# Patient Record
Sex: Male | Born: 1945 | Race: White | Hispanic: No | Marital: Married | Smoking: Never smoker
Health system: Southern US, Community
[De-identification: ages and names within clinical notes are randomized; demographics above are authoritative.]

## PROBLEM LIST (undated history)

## (undated) DIAGNOSIS — K219 Gastro-esophageal reflux disease without esophagitis: Secondary | ICD-10-CM

## (undated) DIAGNOSIS — K635 Polyp of colon: Secondary | ICD-10-CM

## (undated) DIAGNOSIS — M1990 Unspecified osteoarthritis, unspecified site: Secondary | ICD-10-CM

## (undated) DIAGNOSIS — H9190 Unspecified hearing loss, unspecified ear: Secondary | ICD-10-CM

## (undated) DIAGNOSIS — K5792 Diverticulitis of intestine, part unspecified, without perforation or abscess without bleeding: Secondary | ICD-10-CM

## (undated) DIAGNOSIS — H269 Unspecified cataract: Secondary | ICD-10-CM

## (undated) DIAGNOSIS — I10 Essential (primary) hypertension: Secondary | ICD-10-CM

## (undated) HISTORY — PX: KNEE ARTHROSCOPY: SUR90

## (undated) HISTORY — PX: JOINT REPLACEMENT: SHX530

## (undated) HISTORY — DX: Unspecified hearing loss, unspecified ear: H91.90

## (undated) HISTORY — PX: PARTIAL COLECTOMY: SHX5273

## (undated) HISTORY — DX: Diverticulitis of intestine, part unspecified, without perforation or abscess without bleeding: K57.92

## (undated) HISTORY — DX: Morbid (severe) obesity due to excess calories: E66.01

## (undated) HISTORY — DX: Unspecified osteoarthritis, unspecified site: M19.90

## (undated) HISTORY — DX: Polyp of colon: K63.5

## (undated) HISTORY — PX: COLONOSCOPY W/ POLYPECTOMY: SHX1380

## (undated) HISTORY — PX: BACK SURGERY: SHX140

## (undated) HISTORY — DX: Unspecified cataract: H26.9

---

## 2002-06-30 ENCOUNTER — Ambulatory Visit (HOSPITAL_COMMUNITY): Admission: RE | Admit: 2002-06-30 | Discharge: 2002-06-30 | Payer: Self-pay | Admitting: *Deleted

## 2002-06-30 ENCOUNTER — Encounter (INDEPENDENT_AMBULATORY_CARE_PROVIDER_SITE_OTHER): Payer: Self-pay | Admitting: Specialist

## 2003-08-31 ENCOUNTER — Encounter: Payer: Self-pay | Admitting: Family Medicine

## 2003-08-31 ENCOUNTER — Encounter: Admission: RE | Admit: 2003-08-31 | Discharge: 2003-08-31 | Payer: Self-pay | Admitting: Family Medicine

## 2004-04-06 ENCOUNTER — Ambulatory Visit (HOSPITAL_COMMUNITY): Admission: RE | Admit: 2004-04-06 | Discharge: 2004-04-06 | Payer: Self-pay | Admitting: Orthopedic Surgery

## 2005-01-16 ENCOUNTER — Ambulatory Visit (HOSPITAL_COMMUNITY): Admission: RE | Admit: 2005-01-16 | Discharge: 2005-01-16 | Payer: Self-pay | Admitting: Orthopedic Surgery

## 2006-03-30 ENCOUNTER — Emergency Department (HOSPITAL_COMMUNITY): Admission: EM | Admit: 2006-03-30 | Discharge: 2006-03-31 | Payer: Self-pay | Admitting: Emergency Medicine

## 2008-02-26 ENCOUNTER — Encounter: Admission: RE | Admit: 2008-02-26 | Discharge: 2008-02-26 | Payer: Self-pay | Admitting: Family Medicine

## 2009-02-11 ENCOUNTER — Encounter: Admission: RE | Admit: 2009-02-11 | Discharge: 2009-02-11 | Payer: Self-pay | Admitting: Family Medicine

## 2009-02-16 ENCOUNTER — Inpatient Hospital Stay (HOSPITAL_COMMUNITY): Admission: AD | Admit: 2009-02-16 | Discharge: 2009-02-23 | Payer: Self-pay | Admitting: General Surgery

## 2009-08-02 ENCOUNTER — Encounter: Admission: RE | Admit: 2009-08-02 | Discharge: 2009-08-02 | Payer: Self-pay | Admitting: Family Medicine

## 2010-03-01 ENCOUNTER — Inpatient Hospital Stay (HOSPITAL_COMMUNITY): Admission: AD | Admit: 2010-03-01 | Discharge: 2010-03-04 | Payer: Self-pay | Admitting: Gastroenterology

## 2010-03-01 ENCOUNTER — Encounter: Admission: RE | Admit: 2010-03-01 | Discharge: 2010-03-01 | Payer: Self-pay | Admitting: Gastroenterology

## 2010-03-03 ENCOUNTER — Ambulatory Visit: Payer: Self-pay | Admitting: Infectious Disease

## 2010-03-08 ENCOUNTER — Encounter: Admission: RE | Admit: 2010-03-08 | Discharge: 2010-03-08 | Payer: Self-pay | Admitting: Gastroenterology

## 2010-03-13 ENCOUNTER — Encounter: Payer: Self-pay | Admitting: Infectious Disease

## 2010-03-17 ENCOUNTER — Telehealth: Payer: Self-pay | Admitting: Infectious Disease

## 2010-03-17 DIAGNOSIS — K5732 Diverticulitis of large intestine without perforation or abscess without bleeding: Secondary | ICD-10-CM | POA: Insufficient documentation

## 2010-03-23 ENCOUNTER — Ambulatory Visit: Payer: Self-pay | Admitting: Infectious Disease

## 2010-03-23 DIAGNOSIS — L03319 Cellulitis of trunk, unspecified: Secondary | ICD-10-CM

## 2010-03-23 DIAGNOSIS — A498 Other bacterial infections of unspecified site: Secondary | ICD-10-CM | POA: Insufficient documentation

## 2010-03-23 DIAGNOSIS — L02219 Cutaneous abscess of trunk, unspecified: Secondary | ICD-10-CM

## 2010-03-23 LAB — CONVERTED CEMR LAB
Bilirubin Urine: NEGATIVE
Hemoglobin, Urine: NEGATIVE
Ketones, ur: NEGATIVE mg/dL
Nitrite: NEGATIVE
Protein, ur: NEGATIVE mg/dL

## 2010-03-24 ENCOUNTER — Encounter: Payer: Self-pay | Admitting: Infectious Disease

## 2010-04-06 ENCOUNTER — Encounter: Payer: Self-pay | Admitting: Infectious Disease

## 2010-04-07 ENCOUNTER — Encounter: Payer: Self-pay | Admitting: Infectious Disease

## 2010-04-07 ENCOUNTER — Encounter: Admission: RE | Admit: 2010-04-07 | Discharge: 2010-04-07 | Payer: Self-pay | Admitting: Surgery

## 2010-04-10 ENCOUNTER — Telehealth: Payer: Self-pay | Admitting: Infectious Disease

## 2010-04-12 ENCOUNTER — Encounter: Payer: Self-pay | Admitting: Infectious Disease

## 2010-04-14 ENCOUNTER — Telehealth: Payer: Self-pay | Admitting: Infectious Disease

## 2010-04-27 ENCOUNTER — Encounter (INDEPENDENT_AMBULATORY_CARE_PROVIDER_SITE_OTHER): Payer: Self-pay | Admitting: Surgery

## 2010-04-27 ENCOUNTER — Inpatient Hospital Stay (HOSPITAL_COMMUNITY): Admission: RE | Admit: 2010-04-27 | Discharge: 2010-04-30 | Payer: Self-pay | Admitting: Surgery

## 2010-05-01 ENCOUNTER — Encounter: Payer: Self-pay | Admitting: Infectious Disease

## 2010-05-10 ENCOUNTER — Encounter: Payer: Self-pay | Admitting: Infectious Disease

## 2010-07-11 ENCOUNTER — Encounter: Payer: Self-pay | Admitting: Infectious Disease

## 2010-11-15 ENCOUNTER — Encounter
Admission: RE | Admit: 2010-11-15 | Discharge: 2010-11-15 | Payer: Self-pay | Source: Home / Self Care | Attending: Family Medicine | Admitting: Family Medicine

## 2010-12-26 NOTE — Miscellaneous (Signed)
Summary: [facility]: Home Health Cert. & Plan Of Care  [facility]: [facility] Cert[facility] Plan Of Care   Imported By: Florinda Marker 04/26/2010 13:52:16  _____________________________________________________________________  External Attachment:    Type:   Image     Comment:   External Document

## 2010-12-26 NOTE — Assessment & Plan Note (Signed)
Summary: hsfu   Visit Type:  Follow-up  CC:  hsfu.  History of Present Illness: 65-yo male with recurrent diverticulitis.  He had an initial 5 yrs ago that was resolved with an unknown   antibiotic.He then was admitted on February 16, 2009, after he had failed  oral ciprofloxacin and metronidazole. He was found to have a pelvic  abscess which was drained under CT guidance.This grew E. coli that was  resistant to ciprofloxacin, also resistant to ampicillin, ampicillin   sulbactam, cefazolin and cefoxitin but sensitive to carbapenems and he  was treated at that time with intravenous ertapenem with resolution of  his symptoms. Approximately 10 d prior to admission he developed some suprapubic  discomfort and difficulty urinating. He failed cipro and flagyl and was admitted and found to have sigmoid diverticulitis with two extraluminal collections of  contained air, one of which is contiguous with the bladder. He was dc on invanz and was doing well  and repeat CT scan on the 13th showed improvement in his collections. Then l he developed sudden onset of abdominal pain last week as well as a subjective fever. I extended his invanz in the interim. His abdominal pain improved and his fevers vanished. However I have remaind concerned about his complicated. diverticullitis and afte he left today I called Dr. Michaell Cowing from [facility] and we are arranging a CT of his asdomen and pelvis and extending his invanz further. He denies dysuria or hematuria.    Preventive Screening-Counseling & Management  Alcohol-Tobacco     Smoking Status: never   Current Allergies (reviewed today): ! PENICILLIN G SODIUM (PENICILLIN G SODIUM) Past History:  Past Medical History:     1. Recurrent diverticulitis.   2. Morbid obesity.   3. History of diverticular abscess status post percutaneous drainage       in March 2010.   4. Osteoarthritis.   5. Hearing loss.   6. Cataracts.   7. Colon polyps.   8. History of knee  arthroscopy.  Past Surgical History: knee arthroscopy  Family History: noncontributory  Social History: Married Never Smoked Alcohol use-yes rare  Review of Systems       The patient complains of fever and abdominal pain.  The patient denies anorexia, weight loss, weight gain, vision loss, decreased hearing, hoarseness, chest pain, syncope, dyspnea on exertion, peripheral edema, prolonged cough, headaches, hemoptysis, melena, hematochezia, severe indigestion/heartburn, hematuria, incontinence, genital sores, muscle weakness, suspicious skin lesions, transient blindness, difficulty walking, depression, unusual weight change, abnormal bleeding, enlarged lymph nodes, angioedema, and breast masses.    Vital Signs:  Patient profile:   65 year old male Height:      67.5 inches (171.45 cm) Weight:      269 pounds (122.27 kg) BMI:     41.66 Temp:     98.1 degrees F (36.72 degrees C) oral Pulse rate:   73 / minute BP sitting:   133 / 82  (left arm)  Vitals Entered By: Starleen Arms CMA (March 23, 2010 9:15 AM) CC: hsfu Is Patient Diabetic? No Pain Assessment Patient in pain? no      Nutritional Status BMI of > 30 = obese Nutritional Status Detail nl  Does patient need assistance? Functional Status Self care Ambulation Normal   Physical Exam  General:  alert, well-developed, well-nourished, and well-hydrated.   Head:  normocephalic, atraumatic, and no abnormalities observed.   Eyes:  vision grossly intact, pupils equal, and pupils round.   Ears:  no external deformities.  Nose:  no external deformity and no external erythema.   Mouth:  good dentition, no dental plaque, pharynx pink and moist, no erythema, and no exudates.   Neck:  supple and full ROM.   Lungs:  normal respiratory effort, no crackles, and no wheezes.   Heart:  normal rate, regular rhythm, no murmur, no gallop, and no rub.   Abdomen:  soft, normal bowel sounds, and no distention.  minimaL tewnderness in  suprapubic area no rebound Msk:  normal ROM.   Extremities:  No clubbing, cyanosis, edema, or deformity noted with normal full range of motion of all joints.   Neurologic:  alert & oriented X3, strength normal in all extremities, and sensation intact to light touch.   Skin:  no rashes and no suspicious lesions.   Psych:  Oriented X3, memory intact for recent and remote, normally interactive, and good eye contact.     Impression & Recommendations:  Problem # 1:  DIVERTICULITIS OF COLON (ICD-562.11) I have discussed with Dr. Michaell Cowing. We will get CT abd pelivs. I will extend his ertapenem in the interim. There has been concern for possible colovesicular fistula as well but pts most recent ua was clean. I spent greater than 65 minutes coordinating the care of this complicated patient including phone calls to his surgeron Dr. Ashok Cordia, and radiology Orders: T-Urinalysis [phone]) T-Culture, Urine ([id number]) CT with Contrast (CT w/ contrast) Est. Patient Level V (32355)  Problem # 2:  ABSCESS, INTRAABDOMINAL (ICD-682.2)  see above, concern is for this.  Orders: Est. Patient Level V (73220)  Problem # 3:  ESCHERICHIA COLI INFECTION IN CCE & UNS SITE (ICD-041.4)  was cipro R as above,  with his PCN allergy the carbapenem has been best drug to cover this plus anaerobes  Orders: Est. Patient Level V (25427)  Medications Added to Medication List This Visit: 1)  Prevacid 30 Mg Cpdr (Lansoprazole) .Marland Kitchen.. 1 once daily  Patient Instructions: 1)  I will talk to Dr. Michaell Cowing re repeta CT 2)  [facility] will have [facility] maintain your PICC int he meantime

## 2010-12-26 NOTE — Consult Note (Signed)
Summary: Consultation Report  Consultation Report   Imported By: Florinda Marker 04/04/2010 10:35:43  _____________________________________________________________________  External Attachment:    Type:   Image     Comment:   External Document

## 2010-12-26 NOTE — Consult Note (Signed)
Summary: Dr. Michaell Cowing  Dr. Michaell Cowing   Imported By: Florinda Marker 07/11/2010 16:43:53  _____________________________________________________________________  External Attachment:    Type:   Image     Comment:   External Document

## 2010-12-26 NOTE — Consult Note (Signed)
Summary: Primary Care Physician  Primary Care Physician   Imported By: Florinda Marker 05/26/2010 15:31:11  _____________________________________________________________________  External Attachment:    Type:   Image     Comment:   External Document

## 2010-12-26 NOTE — Progress Notes (Signed)
Summary: Care Plan Oversight for DIverticulitis  Phone Note Outgoing Call   Call placed by: Acey Lav MD,  Apr 14, 2010 4:16 PM Details for Reason: Care Plan Oversight Summary of Call: [phone](> 60 mins) I have supervised home care and/or infusion therapy for this pt, including providing orders for care, review of labs and/or home health care plans, communicating with the home health care professionals and/or patient/caregivers to integrate current information into the medical treatment plan and/or adjust the medical therapy. This supervision has been provided for _62__minutes during the calendar month. Dates for this oversight _5/9/11 thru 05/03/10.  PATIENT BEING MANAGED FOR COMPLICATED DIVERTICULTIS   Initial call taken by: Acey Lav MD,  Apr 14, 2010 4:17 PM

## 2010-12-26 NOTE — Progress Notes (Signed)
Summary: DC picc line  Phone Note Outgoing Call   Call placed by: Acey Lav MD,  Apr 10, 2010 2:11 PM Details for Reason: CT scan Summary of Call: trying to get ahold of pt and to discuss scan with [facility] surgeon. Pt had CT scan on Friday which showed persistence of gas collections, one of whic abuts the bladder dome. There was no obvious fistula or abscess present Initial call taken by: Acey Lav MD,  Apr 10, 2010 2:11 PM  Follow-up for Phone Call        spoke with pt and am waiting to hear back from [facility]. I am inclinded to have PICC line removed but would like to get [facility] thoughts on significance of gas collections persisting. An oral option shoudl we need it would be bactrim and flagyl. Follow-up by: Acey Lav MD,  Apr 10, 2010 4:00 PM  Additional Follow-up for Phone Call Additional follow up Details #1::        Discussed with Dr Donell Beers, and she feels that the gas collections are not proof of ongoing infection and  she would eeel  as I comfortable watching Lawrence Wheeler off of antibiotics. Will dc picc line in am Additional Follow-up by: Acey Lav MD,  Apr 10, 2010 8:30 PM

## 2010-12-26 NOTE — Miscellaneous (Signed)
Summary: HIPAA Restrictions  HIPAA Restrictions   Imported By: Florinda Marker 03/24/2010 14:48:11  _____________________________________________________________________  External Attachment:    Type:   Image     Comment:   External Document

## 2010-12-26 NOTE — Medication Information (Signed)
Summary: [facility]: RX  [facility]: RX   Imported By: Florinda Marker 05/04/2010 15:56:54  _____________________________________________________________________  External Attachment:    Type:   Image     Comment:   External Document

## 2010-12-26 NOTE — Progress Notes (Signed)
Summary: Care Plan Oversight  Phone Note Outgoing Call   Call placed by: Acey Lav MD,  March 17, 2010 4:55 PM Details for Reason: Care Plan Oversight Summary of Call: [phone](> 60 mins) I have supervised home care and/or infusion therapy for this pt, including providing orders for care, review of labs and/or home health care plans, communicating with the home health care professionals and/or patient/caregivers to integrate current information into the medical treatment plan and/or adjust the medical therapy. This supervision has been provided for _62__minutes during the calendar month. Dates for this oversight _4/9/11 thru 5/8/11_.   Initial call taken by: Acey Lav MD,  March 17, 2010 4:56 PM  Follow-up for Phone Call        Pt was having fevers subjective and measure dup to 99.9 and abdoiminal pain and I asked his antibiotics to be extended thru 4/28 at minimum for fu with Follow-up by: Acey Lav MD,  March 17, 2010 5:00 PM  New Problems: DIVERTICULITIS OF COLON (ICD-562.11)   New Problems: DIVERTICULITIS OF COLON (ICD-562.11)  Appended Document: Care Plan Oversight Care Plan Oversight for complicated diverticulitis

## 2010-12-26 NOTE — Miscellaneous (Signed)
Summary: [facility]; Orders  [facility]; Orders   Imported By: Florinda Marker 04/21/2010 15:26:24  _____________________________________________________________________  External Attachment:    Type:   Image     Comment:   External Document

## 2011-02-12 LAB — BASIC METABOLIC PANEL
CO2: 29 mEq/L (ref 19–32)
Calcium: 8.7 mg/dL (ref 8.4–10.5)
Chloride: 104 mEq/L (ref 96–112)
GFR calc Af Amer: >60 mL/min (ref >60)
Glucose, Bld: 104 mg/dL — ABNORMAL HIGH (ref 70–99)
Potassium: 4 mEq/L (ref 3.5–5.1)
Sodium: 141 mEq/L (ref 135–145)

## 2011-02-12 LAB — CBC
HCT: 38.2 % — ABNORMAL LOW (ref 39.0–52.0)
HCT: 42.2 % (ref 39.0–52.0)
MCV: 96.9 fL (ref 78.0–100.0)
Platelets: 206 10*3/uL (ref 150–400)
RBC: 3.95 MIL/uL — ABNORMAL LOW (ref 4.22–5.81)
RBC: 4.35 MIL/uL (ref 4.22–5.81)
WBC: 8.2 10*3/uL (ref 4.0–10.5)
WBC: 5.7 10*3/uL (ref 4.0–10.5)

## 2011-02-12 LAB — POTASSIUM: Potassium: 4 mEq/L (ref 3.5–5.1)

## 2011-02-14 LAB — CBC
HCT: 40.1 % (ref 39.0–52.0)
HCT: 42 % (ref 39.0–52.0)
Hemoglobin: 14.1 g/dL (ref 13.0–17.0)
Hemoglobin: 14.3 g/dL (ref 13.0–17.0)
MCHC: 34 g/dL (ref 30.0–36.0)
MCV: 98.4 fL (ref 78.0–100.0)
Platelets: 255 10*3/uL (ref 150–400)
Platelets: 272 10*3/uL (ref 150–400)
RBC: 4.16 MIL/uL — ABNORMAL LOW (ref 4.22–5.81)
RDW: 13 % (ref 11.5–15.5)
RDW: 13.7 % (ref 11.5–15.5)
WBC: 5.9 10*3/uL (ref 4.0–10.5)
WBC: 6.5 10*3/uL (ref 4.0–10.5)

## 2011-02-14 LAB — COMPREHENSIVE METABOLIC PANEL
Albumin: 3.5 g/dL (ref 3.5–5.2)
Alkaline Phosphatase: 53 U/L (ref 39–117)
BUN: 10 mg/dL (ref 6–23)
Calcium: 8.5 mg/dL (ref 8.4–10.5)
Glucose, Bld: 93 mg/dL (ref 70–99)
Potassium: 3.6 mEq/L (ref 3.5–5.1)
Total Protein: 7 g/dL (ref 6.0–8.3)

## 2011-02-14 LAB — BASIC METABOLIC PANEL
BUN: 6 mg/dL (ref 6–23)
Calcium: 8.4 mg/dL (ref 8.4–10.5)
Creatinine, Ser: 0.94 mg/dL (ref 0.4–1.5)
Creatinine, Ser: 0.99 mg/dL (ref 0.4–1.5)
GFR calc Af Amer: >60 mL/min (ref >60)
GFR calc non Af Amer: >60 mL/min (ref >60)
Glucose, Bld: 91 mg/dL (ref 70–99)
Potassium: 4.1 mEq/L (ref 3.5–5.1)
Sodium: 139 mEq/L (ref 135–145)

## 2011-02-14 LAB — DIFFERENTIAL
Basophils Absolute: 0 10*3/uL (ref 0.0–0.1)
Eosinophils Absolute: 0.1 10*3/uL (ref 0.0–0.7)
Eosinophils Relative: 2 % (ref 0–5)
Lymphocytes Relative: 27 % (ref 12–46)
Lymphocytes Relative: 36 % (ref 12–46)
Lymphs Abs: 2.2 10*3/uL (ref 0.7–4.0)
Monocytes Absolute: 1.1 10*3/uL — ABNORMAL HIGH (ref 0.1–1.0)
Monocytes Relative: 13 % — ABNORMAL HIGH (ref 3–12)
Neutro Abs: 5 10*3/uL (ref 1.7–7.7)
Neutrophils Relative %: 51 % (ref 43–77)
Neutrophils Relative %: 59 % (ref 43–77)

## 2011-02-14 LAB — URINALYSIS, ROUTINE W REFLEX MICROSCOPIC
Bilirubin Urine: NEGATIVE
Ketones, ur: NEGATIVE mg/dL
Nitrite: NEGATIVE
Protein, ur: NEGATIVE mg/dL
Urobilinogen, UA: 0.2 mg/dL (ref 0.0–1.0)
pH: 6.5 (ref 5.0–8.0)

## 2011-02-14 LAB — URINE CULTURE
Colony Count: NO GROWTH
Culture: NO GROWTH
Special Requests: NEGATIVE

## 2011-02-14 LAB — HIV ANTIBODY (ROUTINE TESTING W REFLEX): HIV: NONREACTIVE

## 2011-03-08 LAB — CBC
HCT: 41 % (ref 39.0–52.0)
HCT: 45 % (ref 39.0–52.0)
Hemoglobin: 14.2 g/dL (ref 13.0–17.0)
Hemoglobin: 15.2 g/dL (ref 13.0–17.0)
Platelets: 236 10*3/uL (ref 150–400)
Platelets: 282 10*3/uL (ref 150–400)
RBC: 4.56 MIL/uL (ref 4.22–5.81)
RDW: 14.4 % (ref 11.5–15.5)
WBC: 11.8 10*3/uL — ABNORMAL HIGH (ref 4.0–10.5)
WBC: 6.4 10*3/uL (ref 4.0–10.5)

## 2011-03-08 LAB — URINE CULTURE
Colony Count: NO GROWTH
Culture: NO GROWTH
Special Requests: NEGATIVE

## 2011-03-08 LAB — COMPREHENSIVE METABOLIC PANEL
Albumin: 3.8 g/dL (ref 3.5–5.2)
Alkaline Phosphatase: 51 U/L (ref 39–117)
BUN: 12 mg/dL (ref 6–23)
CO2: 27 mEq/L (ref 19–32)
Chloride: 103 mEq/L (ref 96–112)
GFR calc non Af Amer: 56 mL/min — ABNORMAL LOW (ref >60)
Glucose, Bld: 104 mg/dL — ABNORMAL HIGH (ref 70–99)
Potassium: 4.1 mEq/L (ref 3.5–5.1)
Total Bilirubin: 1.8 mg/dL — ABNORMAL HIGH (ref 0.3–1.2)

## 2011-03-08 LAB — BASIC METABOLIC PANEL
BUN: 9 mg/dL (ref 6–23)
Calcium: 8.3 mg/dL — ABNORMAL LOW (ref 8.4–10.5)
GFR calc non Af Amer: >60 mL/min (ref >60)
GFR calc non Af Amer: >60 mL/min (ref >60)
Glucose, Bld: 119 mg/dL — ABNORMAL HIGH (ref 70–99)
Potassium: 4.8 mEq/L (ref 3.5–5.1)
Sodium: 132 mEq/L — ABNORMAL LOW (ref 135–145)
Sodium: 138 mEq/L (ref 135–145)

## 2011-03-08 LAB — URINALYSIS, ROUTINE W REFLEX MICROSCOPIC
Bilirubin Urine: NEGATIVE
Ketones, ur: NEGATIVE mg/dL
Nitrite: NEGATIVE
Specific Gravity, Urine: 1.005 (ref 1.005–1.030)
Urobilinogen, UA: 0.2 mg/dL (ref 0.0–1.0)

## 2011-03-08 LAB — CULTURE, ROUTINE-ABSCESS

## 2011-03-08 LAB — DIFFERENTIAL
Basophils Absolute: 0 10*3/uL (ref 0.0–0.1)
Basophils Relative: 0 % (ref 0–1)
Monocytes Absolute: 1 10*3/uL (ref 0.1–1.0)
Neutro Abs: 9.1 10*3/uL — ABNORMAL HIGH (ref 1.7–7.7)
Neutrophils Relative %: 77 % (ref 43–77)

## 2011-04-10 NOTE — H&P (Signed)
NAMECYPRUS, Lawrence Wheeler NO.:  [id number]   MEDICAL RECORD NO.:  [id number]          PATIENT TYPE:  INP   LOCATION:                               FACILITY:  [facility]   PHYSICIAN:  Currie Paris, M.D.DATE OF BIRTH:  1946-01-05   DATE OF ADMISSION:  02/16/2009  DATE OF DISCHARGE:                              HISTORY & PHYSICAL   CHIEF COMPLAINT:  Abdominal pain.   HISTORY OF PRESENT ILLNESS:  Lawrence Wheeler came to our urgent office today  with left lower quadrant and suprapubic abdominal pain.  He has actually  been treated for diverticulitis for about the last 3 weeks, initially  just with some Cipro, and a few days ago, Flagyl was added to the  regimen.  Unfortunately, he has not really gotten any better over the  last several days.  He has had persistent nausea but no vomiting.  He  has had some low grade fevers at home.  He notes he has had several  episodes of what was diagnosed as diverticulitis over the last 3-4  years, all of which resolved with treatment of Cipro, none of which he  needed to be hospitalized for.  Dr. Leonides Sake asked [place] to see him  today urgently because of his failure to improve on antibiotics and the  fact he was having increasing nausea with his metronidazole.   He notes that the pain seems to be mainly suprapubic with some extension  a little bit to the right of midline and a little to the left.  It seems  to be coming a little bit more diffuse across the lower abdomen over the  last couple of days but still pretty well localized into the lower  midline of the abdomen.  He says this is similar to what he has had  before.   PAST MEDICAL HISTORY:  Medications include Prevacid, Advil and is  currently on Cipro and metronidazole.   He is allergic to PENICILLIN which he has not had for probably 25 or 30  years but apparently did have significant swelling, so it sounds like  that was probably an anaphylactic reaction.   FAMILY HISTORY:   Is unremarkable.  His parents were deceased.  He has  brothers and sisters who are living.   SOCIAL HISTORY:  The patient is married with 1 child.  He does not  smoke.  Drinks occasional beer weekly.  A 15-point ROS was filled out by  the patient and reviewed.  In addition to his current symptoms, he has  had a little bit of difficulty urinating, and he has had some problems  with rashes and easy bruisability.  He has had some hearing loss and  wears glasses.   PHYSICAL EXAMINATION:  VITAL SIGNS:  Weight is 269, blood pressure  147/91, pulse 77, temperature 99.7.  GENERAL:  The patient is alert and oriented, appears slightly  uncomfortable but certainly not toxic.  HEAD:  Normocephalic.  EYES:  Nonicteric.  Pupils were round and  regular.  PHARYNX:  Normal.  Does not appear to be particularly dry.  NECK:  Supple.  No masses or thyromegaly.  LUNGS:  Normal respirations.  Clear to auscultation.  HEART:  Regular rhythm.  No murmurs, rubs or gallops are heard.  Intact  carotid, dorsalis pedis, posterior tibial pulses.  ABDOMEN:  Slightly distended, but he is obese.  He has a small umbilical  hernia.  The upper half of the abdomen is virtually nontender.  He has  fairly significant tenderness in the lower midline primarily with a  little extension to the right and the left.  He has bowel sounds, but  they sound diminished to me.  He has mild rebound, but it is confined  pretty much to the midline abdomen.  There is no hepatosplenomegaly  noted.   DATA REVIEW:  I looked over the notes from [facility].  I  have also reviewed his CT scan.  The CT does note the presence of small  umbilical hernia.  Also noted is a large air collection in the lower  midline just above the bladder which was read as a giant sigmoid  diverticulum, but I am concerned it is some sort of a collection of air  from a perforation.  He does have signs of diverticulitis.   IMPRESSION:  I think he has acute  diverticulitis with some form of a  confined perforation since he does not have general peritonitis.   RECOMMENDATION:  I think he needs to come into the hospital and on IV  antibiotics.  I think we will use Invanz since he has a penicillin  allergy and has not improved with Cipro and Flagyl.  He will be admitted  to Dr. Claud Kelp since Dr. Derrell Lolling is on call today.  We will go  ahead and get some urgent lab studies, make him n.p.o. and repeat his CT  scan with both oral and perhaps some rectal contrast.  This will help [place]  decide whether he has some drainable collection or may at some point  need to be operated on.      Currie Paris, M.D.  Electronically Signed     CJS/MEDQ  D:  02/16/2009  T:  02/16/2009  Job:  [id number]   cc:   Holley Bouche, M.D.  Fax: [phone]

## 2011-04-13 NOTE — Op Note (Signed)
   NAMENAMARI, BRETON NO.:  [id number]   MEDICAL RECORD NO.:  [id number]                   PATIENT TYPE:  AMB   LOCATION:  ENDO                                 FACILITY:  [facility]   PHYSICIAN:  Georgiana Spinner, M.D.                 DATE OF BIRTH:  1946/06/02   DATE OF PROCEDURE:  06/30/2002  DATE OF DISCHARGE:                                 OPERATIVE REPORT   PROCEDURE:  Upper endoscopy.   INDICATIONS:  Hemoccult positivity.   ANESTHESIA:  Demerol 80 mg and Versed 8 mg.   PROCEDURE:  With the patient mildly sedated in the left lateral decubitus  position, the Olympus videoscopic endoscope was inserted into the mouth and  passed under direct vision through the esophagus which appeared normal,  until it reached it the distal esophagus, and there. Was a questionable area  of Barrett's photographed and biopsied.  We entered into the stomach.  The  fundus, body, antrum, duodenal bulb, second portion and duodenum were all  visualized from this point.  The endoscope was slowly withdrawn and taken  circumferentially through entire duodenal mucosa, until the endoscope then  pulled back into the stomach and placed in retroflexion and viewed the  stomach from below.  The endoscope was then straightened and withdrawn,  taking circumferential views of the remaining gastric and esophageal mucosa.  The patient's vital signs and pulse oximeter remained stable.  The patient  tolerated the procedure well without apparent complications.   FINDINGS:  1. Question of Barrett's esophagus, await biopsy report.  The patient will     call me for results and follow up with me as an outpatient.  2. Proceed to colonoscopy as planned.                                               Georgiana Spinner, M.D.    GMO/MEDQ  D:  06/30/2002  T:  07/03/2002  Job:  [id number]

## 2011-04-13 NOTE — Discharge Summary (Signed)
Lawrence Wheeler NO.:  [id number]   MEDICAL RECORD NO.:  [id number]          PATIENT TYPE:  INP   LOCATION:  1533                         FACILITY:  [facility]   PHYSICIAN:  Angelia Mould. Derrell Lolling, M.D.DATE OF BIRTH:  September 30, 1946   DATE OF ADMISSION:  02/16/2009  DATE OF DISCHARGE:  02/23/2009                               DISCHARGE SUMMARY   FINAL DIAGNOSES:  1. Acute diverticulitis with contained perforation and abscess.  2. Obesity.   OPERATION PERFORMED:  CT-guided percutaneous drainage of  peridiverticular abscess, date February 17, 2009.   HISTORY:  This is a 65 year old Caucasian gentleman who was evaluated by  Dr. Cyndia Bent in our office on February 16, 2009, for abdominal  pain.  He had a history of being on antibiotics for 3 weeks for  diverticulitis, initially with some Cipro, but more recently Flagyl was  added to the regimen, but he had not improved.  He had low-grade fevers  at home.  He had a history of being diagnosed with diverticulitis in the  past with outpatient treatment.  The patient was transferred to [facility] for admission and my care because of failure of outpatient  therapy.   PHYSICAL EXAMINATION:  GENERAL:  Alert, cooperative, uncomfortable, but  not toxic.  Weight 269.  Temperature 99.7, blood pressure 147/91, pulse  77.  HEART:  Regular rate and rhythm.  No murmurs or ectopy.  LUNGS:  Clear to auscultation.  ABDOMEN:  Slightly distended.  Obese.  Small umbilical hernia.  Upper  abdomen is nontender.  Significant tenderness in the lower midline, some  guarding.  No apparent mass.  Liver and spleen not enlarged.   HOSPITAL COURSE:  The patient was directly admitted to [facility] under my care.  I examined the patient upon arrival.  Initial  lab work showed a hemoglobin of 15.2 and a white count of 11,800.  He  did not look toxic.  He was started on broad-spectrum antibiotics and  maintained at bowel rest.  A  CT scan showed a large air collection above  the bladder with a little bit of fluid in it.  There was no contrast  extravasation.  There was no obstruction.  He looked like he had  diverticulitis adjacent to this.  I discussed this with radiology.  We  elected to go ahead and place a pigtail catheter in this and the  radiologist, Dr. Lowella Dandy did that on February 17, 2009.  Before he did this,  he repeated the rectal contrast and once again showed no connection  between the abscess and the colon.  We placed a 10-French drain which  immediately decompressed the air.   The patient improved slowly, but steadily thereafter.  White blood cell  count was down to 7900 the next day.  There was some red purulent fluid  that came out of that, but over the next couple days became more serous  in nature.  He was started on a liquid diet on February 18, 2009, and  tolerated that fairly well.  He was advanced to a low-residue  diet on  February 20, 2009.  He progressed and improved.  Cultures from the abscess  grew E-coli.  JP drainage dropped down to less than 10 mL a day and was  serous in nature.  I discussed his management with radiology.  We felt  that it would be appropriate to pull the abscess drain which we did on  February 22, 2009.  On February 23, 2009, he felt well.  We elected to  discharge him home at that time because he was tolerating a diet and  having stools.  A PICC line was  placed and plans were made for him to stay on intravenous Invanz for 10  more days, as well as a proton pump inhibitor and Metamucil.  He was  asked to return to see me in 2-3 weeks.  He was advised that he would  need a colonoscopy and probably a barium enema in 6-8 weeks.      Angelia Mould. Derrell Lolling, M.D.  Electronically Signed     HMI/MEDQ  D:  03/04/2009  T:  03/04/2009  Job:  [id number]   cc:   Holley Bouche, M.D.  Fax: [phone]

## 2011-04-13 NOTE — Op Note (Signed)
   NAMEDMARCUS, DECICCO NO.:  [id number]   MEDICAL RECORD NO.:  [id number]                   PATIENT TYPE:  AMB   LOCATION:  ENDO                                 FACILITY:  [facility]   PHYSICIAN:  Georgiana Spinner, M.D.                 DATE OF BIRTH:  02/02/1946   DATE OF PROCEDURE:  DATE OF DISCHARGE:  06/30/2002                                 OPERATIVE REPORT   PROCEDURE:  Colonoscopy.   INDICATIONS:  Hemoccult positivity.   ANESTHESIA:  Demerol 20 mg, Versed 2 mg.   DESCRIPTION OF PROCEDURE:  With the patient mildly sedated in the left  lateral decubitus position, the Olympus videoscopic colonoscope was inserted  into the rectum after a normal rectal exam and passed under direct vision to  the cecum, identified by the ileocecal valve and appendiceal orifice.  From  this point the colonoscope was slowly withdrawn, taking circumferential  views of the entire colonic mucosa, stopping at 20 cm from the anal verge,  at which time a small polyp on a stalk was seen,  photographed, and removed  using snare cautery technique, a setting of 20-20 blended current.  This was  done until we reached the rectum, which appeared normal on direct and  retroflexed view. The endoscope was straightened and withdrawn.  The  patient's vital signs and pulse oximetry remained stable.  The patient  tolerated the procedure well without apparent complications.   FINDINGS:  Polyp at 20 cm from the anal verge.   PLAN:  Await biopsy report.  The patient will call me for results and follow  up with me as an outpatient.                                               Georgiana Spinner, M.D.    GMO/MEDQ  D:  06/30/2002  T:  07/03/2002  Job:  [id number]

## 2014-09-30 ENCOUNTER — Ambulatory Visit: Payer: Self-pay | Admitting: Orthopedic Surgery

## 2014-09-30 NOTE — Progress Notes (Signed)
Preoperative surgical orders have been place into the Epic hospital system for Lawrence Wheeler on 09/30/2014, 12:29 PM  by Mickel Crow for surgery on 12-20-2014.  Preop Total Knee orders including Experal, IV Tylenol, and IV Decadron as long as there are no contraindications to the above medications. [name], PA-C

## 2014-12-07 ENCOUNTER — Ambulatory Visit: Payer: Self-pay | Admitting: Orthopedic Surgery

## 2014-12-07 NOTE — Progress Notes (Signed)
Preoperative surgical orders have been place into the Epic hospital system for Lawrence Wheeler on 12/07/2014, 1:59 PM  by Mickel Crow for surgery on 12-20-2014.  Preop Total Knee orders including Experal, IV Tylenol, and IV Decadron as long as there are no contraindications to the above medications. Lawrence Muslim, PA-C

## 2014-12-13 NOTE — Patient Instructions (Addendum)
Lawrence Wheeler  12/13/2014   Your procedure is scheduled on: 12/20/2013    Report to [facility] Main  Entrance and follow signs to               [place] at        0730 AM.  Call this number if you have problems the morning of surgery [phone]   Remember:  Do not eat food or drink liquids :After Midnight.     Take these medicines the morning of surgery with A SIP OF WATER: Protonix                                You may not have any metal on your body including hair pins and              piercings  Do not wear jewelry, , lotions, powders or perfumes., deodorant             .                Men may shave face and neck.   Do not bring valuables to the hospital. [place].  Contacts, dentures or bridgework may not be worn into surgery.  Leave suitcase in the car. After surgery it may be brought to your room.         Special Instructions: coughing and deep breathing exercises, leg exercises              Please read over the following fact sheets you were given: _____________________________________________________________________             [facility] - Preparing for Surgery Before surgery, you can play an important role.  Because skin is not sterile, your skin needs to be as free of germs as possible.  You can reduce the number of germs on your skin by washing with CHG (chlorahexidine gluconate) soap before surgery.  CHG is an antiseptic cleaner which kills germs and bonds with the skin to continue killing germs even after washing. Please DO NOT use if you have an allergy to CHG or antibacterial soaps.  If your skin becomes reddened/irritated stop using the CHG and inform your nurse when you arrive at Short Stay. Do not shave (including legs and underarms) for at least 48 hours prior to the first CHG shower.  You may shave your face/neck. Please follow these instructions carefully:  1.  Shower  with CHG Soap the night before surgery and the  morning of Surgery.  2.  If you choose to wash your hair, wash your hair first as usual with your  normal  shampoo.  3.  After you shampoo, rinse your hair and body thoroughly to remove the  shampoo.                           4.  Use CHG as you would any other liquid soap.  You can apply chg directly  to the skin and wash                       Gently with a scrungie or clean washcloth.  5.  Apply the CHG Soap to  your body ONLY FROM THE NECK DOWN.   Do not use on face/ open                           Wound or open sores. Avoid contact with eyes, ears mouth and genitals (private parts).                       Wash face,  Genitals (private parts) with your normal soap.             6.  Wash thoroughly, paying special attention to the area where your surgery  will be performed.  7.  Thoroughly rinse your body with warm water from the neck down.  8.  DO NOT shower/wash with your normal soap after using and rinsing off  the CHG Soap.                9.  Pat yourself dry with a clean towel.            10.  Wear clean pajamas.            11.  Place clean sheets on your bed the night of your first shower and do not  sleep with pets. Day of Surgery : Do not apply any lotions/deodorants the morning of surgery.  Please wear clean clothes to the hospital/surgery center.  FAILURE TO FOLLOW THESE INSTRUCTIONS MAY RESULT IN THE CANCELLATION OF YOUR SURGERY PATIENT SIGNATURE_________________________________  NURSE SIGNATURE__________________________________  ________________________________________________________________________  WHAT IS A BLOOD TRANSFUSION? Blood Transfusion Information  A transfusion is the replacement of blood or some of its parts. Blood is made up of multiple cells which provide different functions.  Red blood cells carry oxygen and are used for blood loss replacement.  White blood cells fight against infection.  Platelets control  bleeding.  Plasma helps clot blood.  Other blood products are available for specialized needs, such as hemophilia or other clotting disorders. BEFORE THE TRANSFUSION  Who gives blood for transfusions?   Healthy volunteers who are fully evaluated to make sure their blood is safe. This is blood bank blood. Transfusion therapy is the safest it has ever been in the practice of medicine. Before blood is taken from a donor, a complete history is taken to make sure that person has no history of diseases nor engages in risky social behavior (examples are intravenous drug use or sexual activity with multiple partners). The donor's travel history is screened to minimize risk of transmitting infections, such as malaria. The donated blood is tested for signs of infectious diseases, such as HIV and hepatitis. The blood is then tested to be sure it is compatible with you in order to minimize the chance of a transfusion reaction. If you or a relative donates blood, this is often done in anticipation of surgery and is not appropriate for emergency situations. It takes many days to process the donated blood. RISKS AND COMPLICATIONS Although transfusion therapy is very safe and saves many lives, the main dangers of transfusion include:  1. Getting an infectious disease. 2. Developing a transfusion reaction. This is an allergic reaction to something in the blood you were given. Every precaution is taken to prevent this. The decision to have a blood transfusion has been considered carefully by your caregiver before blood is given. Blood is not given unless the benefits outweigh the risks. AFTER THE TRANSFUSION  Right after receiving a blood transfusion, you will usually  feel much better and more energetic. This is especially true if your red blood cells have gotten low (anemic). The transfusion raises the level of the red blood cells which carry oxygen, and this usually causes an energy increase.  The nurse  administering the transfusion will monitor you carefully for complications. HOME CARE INSTRUCTIONS  No special instructions are needed after a transfusion. You may find your energy is better. Speak with your caregiver about any limitations on activity for underlying diseases you may have. SEEK MEDICAL CARE IF:   Your condition is not improving after your transfusion.  You develop redness or irritation at the intravenous (IV) site. SEEK IMMEDIATE MEDICAL CARE IF:  Any of the following symptoms occur over the next 12 hours:  Shaking chills.  You have a temperature by mouth above 102 F (38.9 C), not controlled by medicine.  Chest, back, or muscle pain.  People around you feel you are not acting correctly or are confused.  Shortness of breath or difficulty breathing.  Dizziness and fainting.  You get a rash or develop hives.  You have a decrease in urine output.  Your urine turns a dark color or changes to pink, red, or brown. Any of the following symptoms occur over the next 10 days:  You have a temperature by mouth above 102 F (38.9 C), not controlled by medicine.  Shortness of breath.  Weakness after normal activity.  The white part of the eye turns yellow (jaundice).  You have a decrease in the amount of urine or are urinating less often.  Your urine turns a dark color or changes to pink, red, or brown. Document Released: 11/09/2000 Document Revised: 02/04/2012 Document Reviewed: 06/28/2008 ExitCare Patient Information 2014 [place].  _______________________________________________________________________  Incentive Spirometer  An incentive spirometer is a tool that can help keep your lungs clear and active. This tool measures how well you are filling your lungs with each breath. Taking long deep breaths may help reverse or decrease the chance of developing breathing (pulmonary) problems (especially infection) following:  A long period of time when you are  unable to move or be active. BEFORE THE PROCEDURE   If the spirometer includes an indicator to show your best effort, your nurse or respiratory therapist will set it to a desired goal.  If possible, sit up straight or lean slightly forward. Try not to slouch.  Hold the incentive spirometer in an upright position. INSTRUCTIONS FOR USE  3. Sit on the edge of your bed if possible, or sit up as far as you can in bed or on a chair. 4. Hold the incentive spirometer in an upright position. 5. Breathe out normally. 6. Place the mouthpiece in your mouth and seal your lips tightly around it. 7. Breathe in slowly and as deeply as possible, raising the piston or the ball toward the top of the column. 8. Hold your breath for 3-5 seconds or for as long as possible. Allow the piston or ball to fall to the bottom of the column. 9. Remove the mouthpiece from your mouth and breathe out normally. 10. Rest for a few seconds and repeat Steps 1 through 7 at least 10 times every 1-2 hours when you are awake. Take your time and take a few normal breaths between deep breaths. 11. The spirometer may include an indicator to show your best effort. Use the indicator as a goal to work toward during each repetition. 12. After each set of 10 deep breaths, practice coughing to  be sure your lungs are clear. If you have an incision (the cut made at the time of surgery), support your incision when coughing by placing a pillow or rolled up towels firmly against it. Once you are able to get out of bed, walk around indoors and cough well. You may stop using the incentive spirometer when instructed by your caregiver.  RISKS AND COMPLICATIONS  Take your time so you do not get dizzy or light-headed.  If you are in pain, you may need to take or ask for pain medication before doing incentive spirometry. It is harder to take a deep breath if you are having pain. AFTER USE  Rest and breathe slowly and easily.  It can be helpful to  keep track of a log of your progress. Your caregiver can provide you with a simple table to help with this. If you are using the spirometer at home, follow these instructions: [place] IF:   You are having difficultly using the spirometer.  You have trouble using the spirometer as often as instructed.  Your pain medication is not giving enough relief while using the spirometer.  You develop fever of 100.5 F (38.1 C) or higher. SEEK IMMEDIATE MEDICAL CARE IF:   You cough up bloody sputum that had not been present before.  You develop fever of 102 F (38.9 C) or greater.  You develop worsening pain at or near the incision site. MAKE SURE YOU:   Understand these instructions.  Will watch your condition.  Will get help right away if you are not doing well or get worse. Document Released: 03/25/2007 Document Revised: 02/04/2012 Document Reviewed: 05/26/2007 [facility] Patient Information 2014 [place], [place].   ________________________________________________________________________

## 2014-12-15 ENCOUNTER — Encounter (HOSPITAL_COMMUNITY)
Admission: RE | Admit: 2014-12-15 | Discharge: 2014-12-15 | Disposition: A | Discharge status: Home / Self Care | Payer: Medicare HMO | Source: Ambulatory Visit | Attending: Orthopedic Surgery | Admitting: Orthopedic Surgery

## 2014-12-15 ENCOUNTER — Encounter (HOSPITAL_COMMUNITY): Payer: Self-pay

## 2014-12-15 DIAGNOSIS — M179 Osteoarthritis of knee, unspecified: Secondary | ICD-10-CM | POA: Insufficient documentation

## 2014-12-15 DIAGNOSIS — Z01818 Encounter for other preprocedural examination: Secondary | ICD-10-CM | POA: Diagnosis not present

## 2014-12-15 HISTORY — DX: Gastro-esophageal reflux disease without esophagitis: K21.9

## 2014-12-15 LAB — CBC
HEMATOCRIT: 45.3 % (ref 39.0–52.0)
Hemoglobin: 15.5 g/dL (ref 13.0–17.0)
MCH: 33.8 pg (ref 26.0–34.0)
MCHC: 34.2 g/dL (ref 30.0–36.0)
MCV: 98.7 fL (ref 78.0–100.0)
Platelets: 193 10*3/uL (ref 150–400)
RBC: 4.59 MIL/uL (ref 4.22–5.81)
RDW: 13.2 % (ref 11.5–15.5)
WBC: 6.8 10*3/uL (ref 4.0–10.5)

## 2014-12-15 LAB — URINALYSIS, ROUTINE W REFLEX MICROSCOPIC
Glucose, UA: NEGATIVE mg/dL
HGB URINE DIPSTICK: NEGATIVE
KETONES UR: NEGATIVE mg/dL
Leukocytes, UA: NEGATIVE
Nitrite: NEGATIVE
PROTEIN: NEGATIVE mg/dL
SPECIFIC GRAVITY, URINE: 1.022 (ref 1.005–1.030)
Urobilinogen, UA: 1 mg/dL (ref 0.0–1.0)
pH: 5.5 (ref 5.0–8.0)

## 2014-12-15 LAB — COMPREHENSIVE METABOLIC PANEL
ALT: 55 U/L — ABNORMAL HIGH (ref 0–53)
ANION GAP: 7 (ref 5–15)
AST: 42 U/L — ABNORMAL HIGH (ref 0–37)
Albumin: 4.3 g/dL (ref 3.5–5.2)
Alkaline Phosphatase: 58 U/L (ref 39–117)
BILIRUBIN TOTAL: 2 mg/dL — AB (ref 0.3–1.2)
BUN: 15 mg/dL (ref 6–23)
CHLORIDE: 103 meq/L (ref 96–112)
CO2: 28 mmol/L (ref 19–32)
Calcium: 9 mg/dL (ref 8.4–10.5)
Creatinine, Ser: 1 mg/dL (ref 0.50–1.35)
GFR calc Af Amer: 88 mL/min — ABNORMAL LOW (ref >90)
GFR, EST NON AFRICAN AMERICAN: 76 mL/min — AB (ref >90)
Glucose, Bld: 104 mg/dL — ABNORMAL HIGH (ref 70–99)
Potassium: 4.4 mmol/L (ref 3.5–5.1)
Sodium: 138 mmol/L (ref 135–145)
Total Protein: 7.5 g/dL (ref 6.0–8.3)

## 2014-12-15 LAB — PROTIME-INR
INR: 1 (ref 0.00–1.49)
Prothrombin Time: 13.3 seconds (ref 11.6–15.2)

## 2014-12-15 LAB — SURGICAL PCR SCREEN
MRSA, PCR: NEGATIVE
Staphylococcus aureus: NEGATIVE

## 2014-12-15 LAB — APTT: aPTT: 24 seconds (ref 24–37)

## 2014-12-16 NOTE — Progress Notes (Addendum)
LOV with Dr Shirline Frees 11/03/2014 on chart Clearance office visit for surgery.

## 2014-12-19 ENCOUNTER — Ambulatory Visit: Payer: Self-pay | Admitting: Orthopedic Surgery

## 2014-12-19 MED ORDER — VANCOMYCIN HCL 10 G IV SOLR
1500.0000 mg | INTRAVENOUS | Status: AC
  Administered 2014-12-20: 1500 mg via INTRAVENOUS
  Filled 2014-12-19: qty 1500

## 2014-12-19 NOTE — H&P (Signed)
Lawrence Wheeler DOB: 09-27-1946 Married / Language: Cleophus Molt / Race: White Male Date of admission:  12/20/2014 CC: Right Knee Pain History of Present Illness  The patient is a 69 year old male who comes in for a preoperative History and Physical. The patient is scheduled for a right total knee arthroplasty to be performed by Dr. Dione Plover. Aluisio, MD at [facility] on 12-20-2014. The patient is being followed for their bilateral knee pain and osteoarthritis. They are now over six months out from bilateral cortisone injections. Symptoms reported today include: pain, aching (sometimes radiating up posterior thigh to buttock on the right side) and difficulty ambulating. The patient feels that they are doing poorly and report their pain level to be moderate to severe. Current treatment includes: relative rest and ice/heat. The patient has reported improvement of their symptoms with: Cortisone injections. He is scheduled for right total knee. His knees have been aching more as he has been up on them here recently, but no new mechanical symptoms. He states that the cortisone did help, but he is getting really tired of his pain and dysfunction in the knees. His right knee bothers him more than the left. He has documented bone on bone arthritis of both knees. He states that both knees are limiting what he can and cannot do. The cortisone did help somewhat with the pain, but didn't help with his function. He is at a stage now where he is ready to go ahead and get the knees fixed. He would prefer to do the right knee first since it has been more problematic for him. They have been treated conservatively in the past for the above stated problem and despite conservative measures, they continue to have progressive pain and severe functional limitations and dysfunction. They have failed non-operative management including home exercise, medications, and injections. It is felt that they would benefit from undergoing  total joint replacement. Risks and benefits of the procedure have been discussed with the patient and they elect to proceed with surgery. There are no active contraindications to surgery such as ongoing infection or rapidly progressive neurological disease.  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 facial and tongue  Intolerances: Flagyl *ANTI-INFECTIVE AGENTS - MISC.* Dizziness, Nausea. PredniSONE *CORTICOSTEROIDS* Nausea, Dizziness.  Family History  Cancer Mother. mother Diabetes Mellitus Paternal Grandfather. Osteoarthritis Brother, Mother, Sister.  Social History Alcohol use current drinker; drinks beer, wine and hard liquor; only occasionally per week Drug/Alcohol Rehab (Currently) no Drug/Alcohol Rehab (Previously) no Tobacco use Never smoker. 03/24/2014 never smoker No history of drug/alcohol rehab Not under pain contract Number of flights of stairs before winded 2-3 1 Illicit drug use no Pain Contract no Marital status married Current work status working full time Exercise Exercises weekly; does gym / [employer] Exercises daily; does running / walking Living situation live with spouse Current drinker 03/24/2014: Currently drinks beer, wine and hard liquor only occasionally per week Children 1 Advance Directives Living Will  Medication History Meloxicam (7.5MG  Tablet, 1 (one) Tablet Oral po qd w/ food, Taken starting 11/16/2014) Active. Ibuprofen (prn) Active. Atorvastatin Calcium (20MG  Tablet, Oral) Active. Pantoprazole Sodium (40MG  Tablet DR, Oral) Active. Lansoprazole (30MG  Capsule DR, Oral)  Active.  Past Surgical History  Arthroscopy of Knee left Colectomy partial Colon Polyp Removal -  Colonoscopy  Review of Systems General Not Present- Chills, Fatigue, Fever, Memory Loss, Night Sweats, Weight Gain and Weight Loss. Skin Not Present- Eczema, Hives, Itching, Lesions and Rash. HEENT Not Present- Dentures, Double Vision, Headache, Hearing Loss, Tinnitus and Visual Loss. Respiratory Not Present- Allergies, Chronic Cough, Coughing up blood, Shortness of breath at rest and Shortness of breath with exertion. Cardiovascular Not Present- Chest Pain, Difficulty Breathing Lying Down, Murmur, Palpitations, Racing/skipping heartbeats and Swelling. Gastrointestinal Not Present- Abdominal Pain, Bloody Stool, Constipation, Diarrhea, Difficulty Swallowing, Heartburn, Jaundice, Loss of appetitie, Nausea and Vomiting. Male Genitourinary Not Present- Blood in Urine, Discharge, Flank Pain, Incontinence, Painful Urination, Urgency, Urinary frequency, Urinary Retention, Urinating at Night and Weak urinary stream. Musculoskeletal Present- Joint Pain. Not Present- Back Pain, Joint Swelling, Morning Stiffness, Muscle Pain, Muscle Weakness and Spasms. Neurological Not Present- Blackout spells, Difficulty with balance, Dizziness, Paralysis, Tremor and Weakness. Psychiatric Not Present- Insomnia.  Vitals  Weight: 270 lb Height: 67.5in Weight was reported by patient. Height was reported by patient. Body Surface Area: 2.31 m Body Mass Index: 41.66 kg/m  BP: 126/74 (Sitting, Right Arm, Standard)   Physical Exam General Mental Status -Alert, cooperative and good historian. General Appearance-pleasant, Not in acute distress. Orientation-Oriented X3. Build & Nutrition-Well nourished and Well developed.  Head and Neck Head-normocephalic, atraumatic . Neck Global Assessment - supple, no bruit auscultated on the right, no bruit auscultated on the left.  Eye Pupil -  Bilateral-Regular and Round. Motion - Bilateral-EOMI.  Chest and Lung Exam Inspection Shape - Barrel-shaped. Auscultation Breath sounds - clear at anterior chest wall and clear at posterior chest wall. Adventitious sounds - No Adventitious sounds.  Cardiovascular Auscultation Rhythm - Regular rate and rhythm. Heart Sounds - S1 WNL and S2 WNL. Murmurs & Other Heart Sounds - Auscultation of the heart reveals - No Murmurs.  Abdomen Inspection Contour - Generalized mild distention. Palpation/Percussion Tenderness - Abdomen is non-tender to palpation. Rigidity (guarding) - Abdomen is soft. Auscultation Auscultation of the abdomen reveals - Bowel sounds normal.  Male Genitourinary Note: Not done, not pertinent to present illness   Musculoskeletal Note: He is alert and oriented in no apparent distress. Both knees showed no effusion. He has varus deformities bilaterally. He has marked crepitus on range of motion. Range is about 5 to 125 on the left, 5 to 120 on the right. He is tender medial greater than lateral on both knees and has no instability.  RADIOGRAPHS: He has bone on bone arthritis, medial and patellofemoral compartments of both knees.   Assessment & Plan  Osteoarthritis of right knee (M17.9) Note:Plan is for a Right Total Knee Replacement by Dr. Wynelle Link.  Plan is to go home with wife. He has a two story home but will place a bed downstairs.  PCP - Dr. Kenton Kingfisher  The patient does not have any contraindications and will receive TXA (tranexamic acid) prior to surgery.  Signed electronically by Joelene Millin, III PA-C

## 2014-12-20 ENCOUNTER — Encounter (HOSPITAL_COMMUNITY): Payer: Self-pay | Admitting: *Deleted

## 2014-12-20 ENCOUNTER — Inpatient Hospital Stay (HOSPITAL_COMMUNITY)
Admission: RE | Admit: 2014-12-20 | Discharge: 2014-12-22 | DRG: 470 | Disposition: A | Discharge status: Home / Self Care | Payer: Medicare HMO | Source: Ambulatory Visit | Attending: Orthopedic Surgery | Admitting: Orthopedic Surgery

## 2014-12-20 ENCOUNTER — Inpatient Hospital Stay (HOSPITAL_COMMUNITY): Payer: Medicare HMO | Admitting: Certified Registered"

## 2014-12-20 ENCOUNTER — Encounter (HOSPITAL_COMMUNITY)
Admission: RE | Disposition: A | Discharge status: Home / Self Care | Payer: Self-pay | Source: Ambulatory Visit | Attending: Orthopedic Surgery

## 2014-12-20 DIAGNOSIS — K219 Gastro-esophageal reflux disease without esophagitis: Secondary | ICD-10-CM | POA: Diagnosis present

## 2014-12-20 DIAGNOSIS — Z86010 Personal history of colonic polyps: Secondary | ICD-10-CM

## 2014-12-20 DIAGNOSIS — Z880 Allergy status to penicillin: Secondary | ICD-10-CM | POA: Diagnosis not present

## 2014-12-20 DIAGNOSIS — M5136 Other intervertebral disc degeneration, lumbar region: Secondary | ICD-10-CM | POA: Diagnosis present

## 2014-12-20 DIAGNOSIS — M1711 Unilateral primary osteoarthritis, right knee: Secondary | ICD-10-CM | POA: Diagnosis present

## 2014-12-20 DIAGNOSIS — M1710 Unilateral primary osteoarthritis, unspecified knee: Secondary | ICD-10-CM | POA: Diagnosis present

## 2014-12-20 DIAGNOSIS — M25561 Pain in right knee: Secondary | ICD-10-CM | POA: Diagnosis present

## 2014-12-20 DIAGNOSIS — H9190 Unspecified hearing loss, unspecified ear: Secondary | ICD-10-CM | POA: Diagnosis present

## 2014-12-20 DIAGNOSIS — M179 Osteoarthritis of knee, unspecified: Secondary | ICD-10-CM | POA: Diagnosis present

## 2014-12-20 HISTORY — PX: TOTAL KNEE ARTHROPLASTY: SHX125

## 2014-12-20 LAB — TYPE AND SCREEN
ABO/RH(D): O POS
Antibody Screen: NEGATIVE

## 2014-12-20 LAB — ABO/RH: ABO/RH(D): O POS

## 2014-12-20 SURGERY — ARTHROPLASTY, KNEE, TOTAL
Anesthesia: Spinal | Site: Knee | Laterality: Right

## 2014-12-20 MED ORDER — HYDROMORPHONE HCL 1 MG/ML IJ SOLN: 0.2500 mg | INTRAMUSCULAR | Status: DC | PRN

## 2014-12-20 MED ORDER — PROMETHAZINE HCL 25 MG/ML IJ SOLN: 6.2500 mg | INTRAMUSCULAR | Status: DC | PRN

## 2014-12-20 MED ORDER — SODIUM CHLORIDE 0.9 % IJ SOLN
INTRAMUSCULAR | Status: AC
  Filled 2014-12-20: qty 10

## 2014-12-20 MED ORDER — EPHEDRINE SULFATE 50 MG/ML IJ SOLN
INTRAMUSCULAR | Status: DC | PRN
  Administered 2014-12-20: 5 mg via INTRAVENOUS
  Administered 2014-12-20 (×2): 10 mg via INTRAVENOUS

## 2014-12-20 MED ORDER — DIPHENHYDRAMINE HCL 12.5 MG/5ML PO ELIX: 12.5000 mg | ORAL_SOLUTION | ORAL | Status: DC | PRN

## 2014-12-20 MED ORDER — POLYETHYLENE GLYCOL 3350 17 G PO PACK: 17.0000 g | PACK | Freq: Every day | ORAL | Status: DC | PRN

## 2014-12-20 MED ORDER — BISACODYL 10 MG RE SUPP: 10.0000 mg | Freq: Every day | RECTAL | Status: DC | PRN

## 2014-12-20 MED ORDER — ONDANSETRON HCL 4 MG/2ML IJ SOLN
INTRAMUSCULAR | Status: AC
  Filled 2014-12-20: qty 2

## 2014-12-20 MED ORDER — PROPOFOL 10 MG/ML IV BOLUS
INTRAVENOUS | Status: AC
  Filled 2014-12-20: qty 20

## 2014-12-20 MED ORDER — SODIUM CHLORIDE 0.9 % IJ SOLN
INTRAMUSCULAR | Status: AC
  Filled 2014-12-20: qty 50

## 2014-12-20 MED ORDER — PROPOFOL INFUSION 10 MG/ML OPTIME
INTRAVENOUS | Status: DC | PRN
  Administered 2014-12-20: 50 ug/kg/min via INTRAVENOUS

## 2014-12-20 MED ORDER — PHENOL 1.4 % MT LIQD: 1.0000 | OROMUCOSAL | Status: DC | PRN

## 2014-12-20 MED ORDER — VANCOMYCIN HCL IN DEXTROSE 1-5 GM/200ML-% IV SOLN
1000.0000 mg | Freq: Two times a day (BID) | INTRAVENOUS | Status: AC
  Administered 2014-12-20: 1000 mg via INTRAVENOUS
  Filled 2014-12-20: qty 200

## 2014-12-20 MED ORDER — BUPIVACAINE HCL (PF) 0.25 % IJ SOLN
INTRAMUSCULAR | Status: AC
  Filled 2014-12-20: qty 30

## 2014-12-20 MED ORDER — KETOROLAC TROMETHAMINE 15 MG/ML IJ SOLN
7.5000 mg | Freq: Four times a day (QID) | INTRAMUSCULAR | Status: AC | PRN
  Administered 2014-12-20 – 2014-12-21 (×2): 7.5 mg via INTRAVENOUS
  Filled 2014-12-20 (×2): qty 1

## 2014-12-20 MED ORDER — DEXAMETHASONE SODIUM PHOSPHATE 10 MG/ML IJ SOLN
INTRAMUSCULAR | Status: AC
  Filled 2014-12-20: qty 1

## 2014-12-20 MED ORDER — DOCUSATE SODIUM 100 MG PO CAPS
100.0000 mg | ORAL_CAPSULE | Freq: Two times a day (BID) | ORAL | Status: DC
  Administered 2014-12-20 – 2014-12-22 (×3): 100 mg via ORAL

## 2014-12-20 MED ORDER — MEPERIDINE HCL 50 MG/ML IJ SOLN: 6.2500 mg | INTRAMUSCULAR | Status: DC | PRN

## 2014-12-20 MED ORDER — SODIUM CHLORIDE 0.9 % IR SOLN
Status: DC | PRN
  Administered 2014-12-20: 1000 mL

## 2014-12-20 MED ORDER — SODIUM CHLORIDE 0.9 % IJ SOLN
INTRAMUSCULAR | Status: DC | PRN
  Administered 2014-12-20: 30 mL

## 2014-12-20 MED ORDER — METOCLOPRAMIDE HCL 5 MG/ML IJ SOLN: 5.0000 mg | Freq: Three times a day (TID) | INTRAMUSCULAR | Status: DC | PRN

## 2014-12-20 MED ORDER — SODIUM CHLORIDE 0.9 % IV SOLN
1000.0000 mg | INTRAVENOUS | Status: AC
  Administered 2014-12-20: 1000 mg via INTRAVENOUS
  Filled 2014-12-20: qty 10

## 2014-12-20 MED ORDER — METOCLOPRAMIDE HCL 10 MG PO TABS: 5.0000 mg | ORAL_TABLET | Freq: Three times a day (TID) | ORAL | Status: DC | PRN

## 2014-12-20 MED ORDER — MIDAZOLAM HCL 5 MG/5ML IJ SOLN
INTRAMUSCULAR | Status: DC | PRN
  Administered 2014-12-20: 2 mg via INTRAVENOUS

## 2014-12-20 MED ORDER — FLEET ENEMA 7-19 GM/118ML RE ENEM: 1.0000 | ENEMA | Freq: Once | RECTAL | Status: AC | PRN

## 2014-12-20 MED ORDER — DEXAMETHASONE SODIUM PHOSPHATE 10 MG/ML IJ SOLN
10.0000 mg | Freq: Once | INTRAMUSCULAR | Status: AC
  Administered 2014-12-21: 10 mg via INTRAVENOUS
  Filled 2014-12-20 (×2): qty 1

## 2014-12-20 MED ORDER — OXYCODONE HCL 5 MG PO TABS
5.0000 mg | ORAL_TABLET | ORAL | Status: DC | PRN
  Administered 2014-12-20: 5 mg via ORAL
  Administered 2014-12-20 (×2): 10 mg via ORAL
  Administered 2014-12-20: 5 mg via ORAL
  Administered 2014-12-21 – 2014-12-22 (×7): 10 mg via ORAL
  Filled 2014-12-20 (×6): qty 2
  Filled 2014-12-20: qty 1
  Filled 2014-12-20 (×2): qty 2
  Filled 2014-12-20: qty 1
  Filled 2014-12-20: qty 2

## 2014-12-20 MED ORDER — ACETAMINOPHEN 325 MG PO TABS
650.0000 mg | ORAL_TABLET | Freq: Four times a day (QID) | ORAL | Status: DC | PRN
  Administered 2014-12-22: 650 mg via ORAL
  Filled 2014-12-20: qty 2

## 2014-12-20 MED ORDER — ACETAMINOPHEN 10 MG/ML IV SOLN
1000.0000 mg | Freq: Once | INTRAVENOUS | Status: AC
  Administered 2014-12-20: 1000 mg via INTRAVENOUS
  Filled 2014-12-20: qty 100

## 2014-12-20 MED ORDER — RIVAROXABAN 10 MG PO TABS
10.0000 mg | ORAL_TABLET | Freq: Every day | ORAL | Status: DC
  Administered 2014-12-21 – 2014-12-22 (×2): 10 mg via ORAL
  Filled 2014-12-20 (×3): qty 1

## 2014-12-20 MED ORDER — FENTANYL CITRATE 0.05 MG/ML IJ SOLN
INTRAMUSCULAR | Status: DC | PRN
  Administered 2014-12-20: 50 ug via INTRAVENOUS
  Administered 2014-12-20 (×2): 25 ug via INTRAVENOUS

## 2014-12-20 MED ORDER — MORPHINE SULFATE 2 MG/ML IJ SOLN
1.0000 mg | INTRAMUSCULAR | Status: DC | PRN
  Administered 2014-12-20 (×2): 1 mg via INTRAVENOUS
  Filled 2014-12-20: qty 1

## 2014-12-20 MED ORDER — METHOCARBAMOL 500 MG PO TABS
500.0000 mg | ORAL_TABLET | Freq: Four times a day (QID) | ORAL | Status: DC | PRN
  Administered 2014-12-20 – 2014-12-22 (×4): 500 mg via ORAL
  Filled 2014-12-20 (×4): qty 1

## 2014-12-20 MED ORDER — MENTHOL 3 MG MT LOZG: 1.0000 | LOZENGE | OROMUCOSAL | Status: DC | PRN

## 2014-12-20 MED ORDER — EPHEDRINE SULFATE 50 MG/ML IJ SOLN
INTRAMUSCULAR | Status: AC
  Filled 2014-12-20: qty 1

## 2014-12-20 MED ORDER — LACTATED RINGERS IV SOLN: INTRAVENOUS | Status: DC

## 2014-12-20 MED ORDER — ATORVASTATIN CALCIUM 20 MG PO TABS
20.0000 mg | ORAL_TABLET | Freq: Every day | ORAL | Status: DC
  Administered 2014-12-20 – 2014-12-21 (×2): 20 mg via ORAL
  Filled 2014-12-20 (×3): qty 1

## 2014-12-20 MED ORDER — BUPIVACAINE LIPOSOME 1.3 % IJ SUSP
20.0000 mL | Freq: Once | INTRAMUSCULAR | Status: DC
  Filled 2014-12-20: qty 20

## 2014-12-20 MED ORDER — PROPOFOL 10 MG/ML IV BOLUS
INTRAVENOUS | Status: DC | PRN
  Administered 2014-12-20: 20 mg via INTRAVENOUS

## 2014-12-20 MED ORDER — LACTATED RINGERS IV SOLN
INTRAVENOUS | Status: DC
  Administered 2014-12-20: 12:00:00 via INTRAVENOUS
  Administered 2014-12-20: 1000 mL via INTRAVENOUS

## 2014-12-20 MED ORDER — BUPIVACAINE HCL 0.25 % IJ SOLN
INTRAMUSCULAR | Status: DC | PRN
  Administered 2014-12-20: 20 mL

## 2014-12-20 MED ORDER — ACETAMINOPHEN 500 MG PO TABS
1000.0000 mg | ORAL_TABLET | Freq: Four times a day (QID) | ORAL | Status: AC
  Administered 2014-12-20 – 2014-12-21 (×4): 1000 mg via ORAL
  Filled 2014-12-20 (×4): qty 2

## 2014-12-20 MED ORDER — SODIUM CHLORIDE 0.9 % IV SOLN
INTRAVENOUS | Status: DC
  Administered 2014-12-20 – 2014-12-21 (×2): via INTRAVENOUS

## 2014-12-20 MED ORDER — LIDOCAINE HCL (CARDIAC) 20 MG/ML IV SOLN
INTRAVENOUS | Status: DC | PRN
  Administered 2014-12-20: 20 mg via INTRAVENOUS

## 2014-12-20 MED ORDER — DEXAMETHASONE SODIUM PHOSPHATE 10 MG/ML IJ SOLN
10.0000 mg | Freq: Once | INTRAMUSCULAR | Status: AC
  Administered 2014-12-20: 10 mg via INTRAVENOUS

## 2014-12-20 MED ORDER — TRAMADOL HCL 50 MG PO TABS
50.0000 mg | ORAL_TABLET | Freq: Four times a day (QID) | ORAL | Status: DC | PRN
  Administered 2014-12-22: 50 mg via ORAL
  Filled 2014-12-20: qty 1

## 2014-12-20 MED ORDER — FENTANYL CITRATE 0.05 MG/ML IJ SOLN
INTRAMUSCULAR | Status: AC
  Filled 2014-12-20: qty 2

## 2014-12-20 MED ORDER — MIDAZOLAM HCL 2 MG/2ML IJ SOLN
INTRAMUSCULAR | Status: AC
  Filled 2014-12-20: qty 2

## 2014-12-20 MED ORDER — METHOCARBAMOL 1000 MG/10ML IJ SOLN
500.0000 mg | Freq: Four times a day (QID) | INTRAVENOUS | Status: DC | PRN
  Administered 2014-12-20: 500 mg via INTRAVENOUS
  Filled 2014-12-20 (×2): qty 5

## 2014-12-20 MED ORDER — BUPIVACAINE LIPOSOME 1.3 % IJ SUSP
INTRAMUSCULAR | Status: DC | PRN
  Administered 2014-12-20: 20 mL

## 2014-12-20 MED ORDER — ACETAMINOPHEN 650 MG RE SUPP: 650.0000 mg | Freq: Four times a day (QID) | RECTAL | Status: DC | PRN

## 2014-12-20 MED ORDER — ONDANSETRON HCL 4 MG/2ML IJ SOLN
INTRAMUSCULAR | Status: DC | PRN
  Administered 2014-12-20: 4 mg via INTRAVENOUS

## 2014-12-20 MED ORDER — ONDANSETRON HCL 4 MG/2ML IJ SOLN: 4.0000 mg | Freq: Four times a day (QID) | INTRAMUSCULAR | Status: DC | PRN

## 2014-12-20 MED ORDER — PANTOPRAZOLE SODIUM 40 MG PO TBEC
40.0000 mg | DELAYED_RELEASE_TABLET | Freq: Every day | ORAL | Status: DC
  Administered 2014-12-21 – 2014-12-22 (×2): 40 mg via ORAL
  Filled 2014-12-20 (×2): qty 1

## 2014-12-20 MED ORDER — ONDANSETRON HCL 4 MG PO TABS: 4.0000 mg | ORAL_TABLET | Freq: Four times a day (QID) | ORAL | Status: DC | PRN

## 2014-12-20 MED ORDER — BUPIVACAINE IN DEXTROSE 0.75-8.25 % IT SOLN
INTRATHECAL | Status: DC | PRN
  Administered 2014-12-20: 1.8 mL via INTRATHECAL

## 2014-12-20 SURGICAL SUPPLY — 59 items
BAG ZIPLOCK 12X15 (MISCELLANEOUS) ×3 IMPLANT
BANDAGE ELASTIC 6 VELCRO ST LF (GAUZE/BANDAGES/DRESSINGS) ×3 IMPLANT
BANDAGE ESMARK 6X9 LF (GAUZE/BANDAGES/DRESSINGS) ×1 IMPLANT
BLADE SAG 18X100X1.27 (BLADE) ×3 IMPLANT
BLADE SAW SGTL 11.0X1.19X90.0M (BLADE) ×3 IMPLANT
BNDG ESMARK 6X9 LF (GAUZE/BANDAGES/DRESSINGS) ×3
BOWL SMART MIX CTS (DISPOSABLE) ×3 IMPLANT
CAPT KNEE TOTAL 3 ATTUNE ×3 IMPLANT
CEMENT HV SMART SET (Cement) ×6 IMPLANT
CLOSURE WOUND 1/2 X4 (GAUZE/BANDAGES/DRESSINGS) ×1
CUFF TOURN SGL QUICK 34 (TOURNIQUET CUFF) ×2
CUFF TRNQT CYL 34X4X40X1 (TOURNIQUET CUFF) ×1 IMPLANT
DECANTER SPIKE VIAL GLASS SM (MISCELLANEOUS) ×3 IMPLANT
DRAPE EXTREMITY T 121X128X90 (DRAPE) ×3 IMPLANT
DRAPE POUCH INSTRU U-SHP 10X18 (DRAPES) ×3 IMPLANT
DRAPE U-SHAPE 47X51 STRL (DRAPES) ×3 IMPLANT
DRSG ADAPTIC 3X8 NADH LF (GAUZE/BANDAGES/DRESSINGS) ×3 IMPLANT
DRSG PAD ABDOMINAL 8X10 ST (GAUZE/BANDAGES/DRESSINGS) IMPLANT
DURAPREP 26ML APPLICATOR (WOUND CARE) ×3 IMPLANT
ELECT REM PT RETURN 9FT ADLT (ELECTROSURGICAL) ×3
ELECTRODE REM PT RTRN 9FT ADLT (ELECTROSURGICAL) ×1 IMPLANT
EVACUATOR 1/8 PVC DRAIN (DRAIN) ×3 IMPLANT
FACESHIELD WRAPAROUND (MASK) ×15 IMPLANT
GAUZE SPONGE 4X4 12PLY STRL (GAUZE/BANDAGES/DRESSINGS) ×3 IMPLANT
GLOVE BIO SURGEON STRL SZ7.5 (GLOVE) IMPLANT
GLOVE BIO SURGEON STRL SZ8 (GLOVE) ×3 IMPLANT
GLOVE BIOGEL PI IND STRL 6.5 (GLOVE) IMPLANT
GLOVE BIOGEL PI IND STRL 8 (GLOVE) ×1 IMPLANT
GLOVE BIOGEL PI INDICATOR 6.5 (GLOVE)
GLOVE BIOGEL PI INDICATOR 8 (GLOVE) ×2
GLOVE SURG SS PI 6.5 STRL IVOR (GLOVE) IMPLANT
GOWN STRL REUS W/TWL LRG LVL3 (GOWN DISPOSABLE) ×3 IMPLANT
GOWN STRL REUS W/TWL XL LVL3 (GOWN DISPOSABLE) IMPLANT
HANDPIECE INTERPULSE COAX TIP (DISPOSABLE) ×2
IMMOBILIZER KNEE 20 (SOFTGOODS) ×3
IMMOBILIZER KNEE 20 THIGH 36 (SOFTGOODS) ×1 IMPLANT
KIT BASIN OR (CUSTOM PROCEDURE TRAY) ×3 IMPLANT
MANIFOLD NEPTUNE II (INSTRUMENTS) ×3 IMPLANT
NDL SAFETY ECLIPSE 18X1.5 (NEEDLE) ×3 IMPLANT
NEEDLE HYPO 18GX1.5 SHARP (NEEDLE) ×6
NS IRRIG 1000ML POUR BTL (IV SOLUTION) ×3 IMPLANT
PACK TOTAL JOINT (CUSTOM PROCEDURE TRAY) ×3 IMPLANT
PAD ABD 8X10 STRL (GAUZE/BANDAGES/DRESSINGS) ×3 IMPLANT
PADDING CAST COTTON 6X4 STRL (CAST SUPPLIES) ×3 IMPLANT
POSITIONER SURGICAL ARM (MISCELLANEOUS) ×3 IMPLANT
SET HNDPC FAN SPRY TIP SCT (DISPOSABLE) ×1 IMPLANT
STRIP CLOSURE SKIN 1/2X4 (GAUZE/BANDAGES/DRESSINGS) ×2 IMPLANT
SUCTION FRAZIER 12FR DISP (SUCTIONS) ×3 IMPLANT
SUT MNCRL AB 4-0 PS2 18 (SUTURE) ×3 IMPLANT
SUT VIC AB 2-0 CT1 27 (SUTURE) ×6
SUT VIC AB 2-0 CT1 TAPERPNT 27 (SUTURE) ×3 IMPLANT
SUT VLOC 180 0 24IN GS25 (SUTURE) ×3 IMPLANT
SYR 20CC LL (SYRINGE) ×3 IMPLANT
SYR 50ML LL SCALE MARK (SYRINGE) ×6 IMPLANT
TOWEL OR 17X26 10 PK STRL BLUE (TOWEL DISPOSABLE) ×3 IMPLANT
TOWEL OR NON WOVEN STRL DISP B (DISPOSABLE) IMPLANT
TRAY FOLEY CATH 14FRSI W/METER (CATHETERS) ×3 IMPLANT
WATER STERILE IRR 1500ML POUR (IV SOLUTION) ×3 IMPLANT
WRAP KNEE MAXI GEL POST OP (GAUZE/BANDAGES/DRESSINGS) ×3 IMPLANT

## 2014-12-20 NOTE — Interval H&P Note (Signed)
History and Physical Interval Note:  12/20/2014 9:24 AM  Lawrence Wheeler  has presented today for surgery, with the diagnosis of OA RIGHT KNEE  The various methods of treatment have been discussed with the patient and family. After consideration of risks, benefits and other options for treatment, the patient has consented to  Procedure(s): RIGHT TOTAL KNEE ARTHROPLASTY (Right) as a surgical intervention .  The patient's history has been reviewed, patient examined, no change in status, stable for surgery.  I have reviewed the patient's chart and labs.  Questions were answered to the patient's satisfaction.     [name]

## 2014-12-20 NOTE — Anesthesia Procedure Notes (Signed)
Spinal Patient location during procedure: OR Start time: 12/20/2014 10:16 AM End time: 12/20/2014 10:20 AM Staffing Anesthesiologist: Montez Hageman Resident/CRNA: Lajuana Carry E Performed by: resident/CRNA  Preanesthetic Checklist Completed: patient identified, site marked, surgical consent, pre-op evaluation, timeout performed, IV checked, risks and benefits discussed and monitors and equipment checked Spinal Block Patient position: sitting Prep: Betadine Patient monitoring: heart rate, continuous pulse ox and blood pressure Approach: midline Location: L2-3 Injection technique: single-shot Needle Needle type: Sprotte  Needle gauge: 24 G Needle length: 10 cm Assessment Sensory level: T6 Additional Notes Expiration date of kit checked and confirmed. Clear CSF pre/post injection, neg heme, neg paresthesia. Patient tolerated procedure well, without complications, return to supine with assist.

## 2014-12-20 NOTE — Transfer of Care (Signed)
Immediate Anesthesia Transfer of Care Note  Patient: Lawrence Wheeler  Procedure(s) Performed: Procedure(s) (LRB): RIGHT TOTAL KNEE ARTHROPLASTY (Right)  Patient Location: PACU  Anesthesia Type: Spinal  Level of Consciousness: sedated, patient cooperative and responds to stimulation  Airway & Oxygen Therapy: Patient Spontanous Breathing and Patient connected to face mask oxgen  Post-op Assessment: Report given to PACU RN and Post -op Vital signs reviewed and stable  Post vital signs: Reviewed and stable  Complications: No apparent anesthesia complications

## 2014-12-20 NOTE — Plan of Care (Signed)
Problem: Consults Goal: Diagnosis- Total Joint Replacement Primary Total Knee     

## 2014-12-20 NOTE — Anesthesia Preprocedure Evaluation (Signed)
Anesthesia Evaluation  Patient identified by MRN, date of birth, ID band Patient awake    Reviewed: Allergy & Precautions, NPO status , Patient's Chart, lab work & pertinent test results  Airway Mallampati: II  TM Distance: >3 FB Neck ROM: Full    Dental no notable dental hx.    Pulmonary neg pulmonary ROS,  breath sounds clear to auscultation  Pulmonary exam normal       Cardiovascular negative cardio ROS  Rhythm:Regular Rate:Normal     Neuro/Psych negative neurological ROS  negative psych ROS   GI/Hepatic negative GI ROS, Neg liver ROS,   Endo/Other  negative endocrine ROS  Renal/GU negative Renal ROS  negative genitourinary   Musculoskeletal negative musculoskeletal ROS (+)   Abdominal   Peds negative pediatric ROS (+)  Hematology negative hematology ROS (+)   Anesthesia Other Findings   Reproductive/Obstetrics negative OB ROS                             Anesthesia Physical Anesthesia Plan  ASA: II  Anesthesia Plan: Spinal   Post-op Pain Management:    Induction:   Airway Management Planned: Simple Face Mask  Additional Equipment:   Intra-op Plan:   Post-operative Plan:   Informed Consent: I have reviewed the patients History and Physical, chart, labs and discussed the procedure including the risks, benefits and alternatives for the proposed anesthesia with the patient or authorized representative who has indicated his/her understanding and acceptance.   Dental advisory given  Plan Discussed with: CRNA  Anesthesia Plan Comments: (No tylenol. Elevated LFTs.)        Anesthesia Quick Evaluation

## 2014-12-20 NOTE — Progress Notes (Signed)
Utilization review completed.  

## 2014-12-20 NOTE — Anesthesia Postprocedure Evaluation (Signed)
  Anesthesia Post-op Note  Patient: Lawrence Wheeler  Procedure(s) Performed: Procedure(s) (LRB): RIGHT TOTAL KNEE ARTHROPLASTY (Right)  Patient Location: PACU  Anesthesia Type: Spinal  Level of Consciousness: awake and alert   Airway and Oxygen Therapy: Patient Spontanous Breathing  Post-op Pain: mild  Post-op Assessment: Post-op Vital signs reviewed, Patient's Cardiovascular Status Stable, Respiratory Function Stable, Patent Airway and No signs of Nausea or vomiting  Last Vitals:  Filed Vitals:   12/20/14 1633  BP: 145/93  Pulse: 88  Temp: 36.6 C  Resp: 15    Post-op Vital Signs: stable   Complications: No apparent anesthesia complications

## 2014-12-20 NOTE — Evaluation (Signed)
Physical Therapy Evaluation Patient Details Name: Lawrence Wheeler MRN: [id number] DOB: [birth year] Today's Date: 12/20/2014   History of Present Illness  69 yo male s/p R TKA 12/20/14  Clinical Impression  On eval, POD 0, pt required Min assist for mobility-able to ambulate ~50 feet with RW. Pt tolerated activity well. Recommend HHPT, RW.     Follow Up Recommendations Home health PT    Equipment Recommendations  Rolling walker with 5" wheels    Recommendations for Other Services OT consult     Precautions / Restrictions Precautions Precautions: Knee;Fall Required Braces or Orthoses: Knee Immobilizer - Right Knee Immobilizer - Right: Discontinue once straight leg raise with < 10 degree lag Restrictions Weight Bearing Restrictions: No RLE Weight Bearing: Weight bearing as tolerated      Mobility  Bed Mobility Overal bed mobility: Needs Assistance Bed Mobility: Supine to Sit;Sit to Supine     Supine to sit: Min assist Sit to supine: Min assist   General bed mobility comments: Assist for R LE  Transfers Overall transfer level: Needs assistance Equipment used: Rolling walker (2 wheeled) Transfers: Sit to/from Stand Sit to Stand: Min assist;From elevated surface         General transfer comment: Assist to rise, stabilize, control descent. VCs safety, technique, hand placement  Ambulation/Gait Ambulation/Gait assistance: Min assist Ambulation Distance (Feet): 50 Feet Assistive device: Rolling walker (2 wheeled) Gait Pattern/deviations: Step-to pattern;Decreased stride length;Antalgic     General Gait Details: VCS safety, technique, sequence. assist to stabilize. pt tolerated well.   Stairs            Wheelchair Mobility    Modified Rankin (Stroke Patients Only)       Balance                                             Pertinent Vitals/Pain Pain Assessment: 0-10 Pain Score: 6  Pain Location: R knee Pain Descriptors /  Indicators: Aching;Sore Pain Intervention(s): Monitored during session;Limited activity within patient's tolerance;Repositioned    Home Living Family/patient expects to be discharged to:: Private residence Living Arrangements: Spouse/significant other Available Help at Discharge: Family Type of Home: House Home Access: Stairs to enter Entrance Stairs-Rails: None Entrance Stairs-Number of Steps: 2 Home Layout: Two level;Able to live on main level with bedroom/bathroom Home Equipment: None      Prior Function Level of Independence: Independent               Hand Dominance        Extremity/Trunk Assessment   Upper Extremity Assessment: Defer to OT evaluation           Lower Extremity Assessment: RLE deficits/detail RLE Deficits / Details: hip flex 2/5, hip abd/add 2/5, moves ankle well    Cervical / Trunk Assessment: Normal  Communication   Communication: No difficulties  Cognition Arousal/Alertness: Awake/alert Behavior During Therapy: WFL for tasks assessed/performed Overall Cognitive Status: Within Functional Limits for tasks assessed                      General Comments      Exercises        Assessment/Plan    PT Assessment Patient needs continued PT services  PT Diagnosis Difficulty walking;Abnormality of gait;Acute pain   PT Problem List Decreased strength;Decreased range of motion;Decreased activity tolerance;Decreased balance;Decreased mobility;Decreased knowledge of use of DME;Pain  PT Treatment Interventions DME instruction;Gait training;Stair training;Functional mobility training;Therapeutic activities;Therapeutic exercise;Patient/family education;Balance training   PT Goals (Current goals can be found in the Care Plan section) Acute Rehab PT Goals Patient Stated Goal: home. regain independence PT Goal Formulation: With patient/family Time For Goal Achievement: 12/27/14 Potential to Achieve Goals: Good    Frequency 7X/week    Barriers to discharge        Co-evaluation               End of Session Equipment Utilized During Treatment: Gait belt;Right knee immobilizer Activity Tolerance: Patient tolerated treatment well Patient left: in bed;with call bell/phone within reach;with family/visitor present           Time: 3545-6256 PT Time Calculation (min) (ACUTE ONLY): 26 min   Charges:   PT Evaluation $Initial PT Evaluation Tier I: 1 Procedure PT Treatments $Gait Training: 8-22 mins $Therapeutic Activity: 8-22 mins   PT G Codes:        Weston Anna, MPT Pager: [phone]

## 2014-12-20 NOTE — H&P (View-Only) (Signed)
Lawrence Wheeler DOB: Feb 17, 1946 Married / Language: Cleophus Molt / Race: White Male Date of admission:  12/20/2014 CC: Right Knee Pain History of Present Illness  The patient is a 69 year old male who comes in for a preoperative History and Physical. The patient is scheduled for a right total knee arthroplasty to be performed by Dr. Dione Plover. Aluisio, MD at [facility] on 12-20-2014. The patient is being followed for their bilateral knee pain and osteoarthritis. They are now over six months out from bilateral cortisone injections. Symptoms reported today include: pain, aching (sometimes radiating up posterior thigh to buttock on the right side) and difficulty ambulating. The patient feels that they are doing poorly and report their pain level to be moderate to severe. Current treatment includes: relative rest and ice/heat. The patient has reported improvement of their symptoms with: Cortisone injections. He is scheduled for right total knee. His knees have been aching more as he has been up on them here recently, but no new mechanical symptoms. He states that the cortisone did help, but he is getting really tired of his pain and dysfunction in the knees. His right knee bothers him more than the left. He has documented bone on bone arthritis of both knees. He states that both knees are limiting what he can and cannot do. The cortisone did help somewhat with the pain, but didn't help with his function. He is at a stage now where he is ready to go ahead and get the knees fixed. He would prefer to do the right knee first since it has been more problematic for him. They have been treated conservatively in the past for the above stated problem and despite conservative measures, they continue to have progressive pain and severe functional limitations and dysfunction. They have failed non-operative management including home exercise, medications, and injections. It is felt that they would benefit from undergoing  total joint replacement. Risks and benefits of the procedure have been discussed with the patient and they elect to proceed with surgery. There are no active contraindications to surgery such as ongoing infection or rapidly progressive neurological disease.  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 facial and tongue  Intolerances: Flagyl *ANTI-INFECTIVE AGENTS - MISC.* Dizziness, Nausea. PredniSONE *CORTICOSTEROIDS* Nausea, Dizziness.  Family History  Cancer Mother. mother Diabetes Mellitus Paternal Grandfather. Osteoarthritis Brother, Mother, Sister.  Social History Alcohol use current drinker; drinks beer, wine and hard liquor; only occasionally per week Drug/Alcohol Rehab (Currently) no Drug/Alcohol Rehab (Previously) no Tobacco use Never smoker. 03/24/2014 never smoker No history of drug/alcohol rehab Not under pain contract Number of flights of stairs before winded 2-3 1 Illicit drug use no Pain Contract no Marital status married Current work status working full time Exercise Exercises weekly; does gym / [employer] Exercises daily; does running / walking Living situation live with spouse Current drinker 03/24/2014: Currently drinks beer, wine and hard liquor only occasionally per week Children 1 Advance Directives Living Will  Medication History Meloxicam (7.5MG  Tablet, 1 (one) Tablet Oral po qd w/ food, Taken starting 11/16/2014) Active. Ibuprofen (prn) Active. Atorvastatin Calcium (20MG  Tablet, Oral) Active. Pantoprazole Sodium (40MG  Tablet DR, Oral) Active. Lansoprazole (30MG  Capsule DR, Oral)  Active.  Past Surgical History  Arthroscopy of Knee left Colectomy partial Colon Polyp Removal -  Colonoscopy  Review of Systems General Not Present- Chills, Fatigue, Fever, Memory Loss, Night Sweats, Weight Gain and Weight Loss. Skin Not Present- Eczema, Hives, Itching, Lesions and Rash. HEENT Not Present- Dentures, Double Vision, Headache, Hearing Loss, Tinnitus and Visual Loss. Respiratory Not Present- Allergies, Chronic Cough, Coughing up blood, Shortness of breath at rest and Shortness of breath with exertion. Cardiovascular Not Present- Chest Pain, Difficulty Breathing Lying Down, Murmur, Palpitations, Racing/skipping heartbeats and Swelling. Gastrointestinal Not Present- Abdominal Pain, Bloody Stool, Constipation, Diarrhea, Difficulty Swallowing, Heartburn, Jaundice, Loss of appetitie, Nausea and Vomiting. Male Genitourinary Not Present- Blood in Urine, Discharge, Flank Pain, Incontinence, Painful Urination, Urgency, Urinary frequency, Urinary Retention, Urinating at Night and Weak urinary stream. Musculoskeletal Present- Joint Pain. Not Present- Back Pain, Joint Swelling, Morning Stiffness, Muscle Pain, Muscle Weakness and Spasms. Neurological Not Present- Blackout spells, Difficulty with balance, Dizziness, Paralysis, Tremor and Weakness. Psychiatric Not Present- Insomnia.  Vitals  Weight: 270 lb Height: 67.5in Weight was reported by patient. Height was reported by patient. Body Surface Area: 2.31 m Body Mass Index: 41.66 kg/m  BP: 126/74 (Sitting, Right Arm, Standard)   Physical Exam General Mental Status -Alert, cooperative and good historian. General Appearance-pleasant, Not in acute distress. Orientation-Oriented X3. Build & Nutrition-Well nourished and Well developed.  Head and Neck Head-normocephalic, atraumatic . Neck Global Assessment - supple, no bruit auscultated on the right, no bruit auscultated on the left.  Eye Pupil -  Bilateral-Regular and Round. Motion - Bilateral-EOMI.  Chest and Lung Exam Inspection Shape - Barrel-shaped. Auscultation Breath sounds - clear at anterior chest wall and clear at posterior chest wall. Adventitious sounds - No Adventitious sounds.  Cardiovascular Auscultation Rhythm - Regular rate and rhythm. Heart Sounds - S1 WNL and S2 WNL. Murmurs & Other Heart Sounds - Auscultation of the heart reveals - No Murmurs.  Abdomen Inspection Contour - Generalized mild distention. Palpation/Percussion Tenderness - Abdomen is non-tender to palpation. Rigidity (guarding) - Abdomen is soft. Auscultation Auscultation of the abdomen reveals - Bowel sounds normal.  Male Genitourinary Note: Not done, not pertinent to present illness   Musculoskeletal Note: He is alert and oriented in no apparent distress. Both knees showed no effusion. He has varus deformities bilaterally. He has marked crepitus on range of motion. Range is about 5 to 125 on the left, 5 to 120 on the right. He is tender medial greater than lateral on both knees and has no instability.  RADIOGRAPHS: He has bone on bone arthritis, medial and patellofemoral compartments of both knees.   Assessment & Plan  Osteoarthritis of right knee (M17.9) Note:Plan is for a Right Total Knee Replacement by Dr. Wynelle Link.  Plan is to go home with wife. He has a two story home but will place a bed downstairs.  PCP - Dr. Kenton Kingfisher  The patient does not have any contraindications and will receive TXA (tranexamic acid) prior to surgery.  Signed electronically by Joelene Millin, III PA-C

## 2014-12-20 NOTE — Op Note (Signed)
Pre-operative diagnosis- Osteoarthritis  Right knee(s)  Post-operative diagnosis- Osteoarthritis Right knee(s)  Procedure-  Right  Total Knee Arthroplasty  Surgeon- Lawrence Wheeler. Lawrence Salzman, MD  Assistant- Lawrence Muslim, PA-C   Anesthesia-  Spinal  EBL-* No blood loss amount entered *   Drains Hemovac  Tourniquet time-  Total Tourniquet Time Documented: Thigh (Right) - 34 minutes Total: Thigh (Right) - 34 minutes     Complications- None  Condition-PACU - hemodynamically stable.   Brief Clinical Note  Lawrence Wheeler is a 69 y.o. year old male with end stage OA of his right knee with progressively worsening pain and dysfunction. He has constant pain, with activity and at rest and significant functional deficits with difficulties even with ADLs. He has had extensive non-op management including analgesics, injections of cortisone, and home exercise program, but remains in significant pain with significant dysfunction. Radiographs show bone on bone arthritis medial and patellofemoral. He presents now for right Total Knee Arthroplasty.    Procedure in detail---   The patient is brought into the operating room and positioned supine on the operating table. After successful administration of  Spinal,   a tourniquet is placed high on the  Right thigh(s) and the lower extremity is prepped and draped in the usual sterile fashion. Time out is performed by the operating team and then the  Right lower extremity is wrapped in Esmarch, knee flexed and the tourniquet inflated to 300 mmHg.       A midline incision is made with a ten blade through the subcutaneous tissue to the level of the extensor mechanism. A fresh blade is used to make a medial parapatellar arthrotomy. Soft tissue over the proximal medial tibia is subperiosteally elevated to the joint line with a knife and into the semimembranosus bursa with a Cobb elevator. Soft tissue over the proximal lateral tibia is elevated with attention being paid to  avoiding the patellar tendon on the tibial tubercle. The patella is everted, knee flexed 90 degrees and the ACL and PCL are removed. Findings are bone on bone medial and patellofemoral with large medial osteophytes.        The drill is used to create a starting hole in the distal femur and the canal is thoroughly irrigated with sterile saline to remove the fatty contents. The 5 degree Right  valgus alignment guide is placed into the femoral canal and the distal femoral cutting block is pinned to remove 10 mm off the distal femur. Resection is made with an oscillating saw.      The tibia is subluxed forward and the menisci are removed. The extramedullary alignment guide is placed referencing proximally at the medial aspect of the tibial tubercle and distally along the second metatarsal axis and tibial crest. The block is pinned to remove 72mm off the more deficient medial  side. Resection is made with an oscillating saw. Size 8is the most appropriate size for the tibia and the proximal tibia is prepared with the modular drill and keel punch for that size.      The femoral sizing guide is placed and size 7 is most appropriate. Rotation is marked off the epicondylar axis and confirmed by creating a rectangular flexion gap at 90 degrees. The size 7 cutting block is pinned in this rotation and the anterior, posterior and chamfer cuts are made with the oscillating saw. The intercondylar block is then placed and that cut is made.      Trial size 8 tibial component, trial size  7 posterior stabilized femur and a 6  mm posterior stabilized rotating platform insert trial is placed. Full extension is achieved with excellent varus/valgus and anterior/posterior balance throughout full range of motion. The patella is everted and thickness measured to be 27  mm. Free hand resection is taken to 15 mm, a 41 template is placed, lug holes are drilled, trial patella is placed, and it tracks normally. Osteophytes are removed off the  posterior femur with the trial in place. All trials are removed and the cut bone surfaces prepared with pulsatile lavage. Cement is mixed and once ready for implantation, the size 8 tibial implant, size  7 posterior stabilized femoral component, and the size 41 patella are cemented in place and the patella is held with the clamp. The trial insert is placed and the knee held in full extension. The Exparel (20 ml mixed with 30 ml saline) and .25% Bupivicaine, are injected into the extensor mechanism, posterior capsule, medial and lateral gutters and subcutaneous tissues.  All extruded cement is removed and once the cement is hard the permanent 6 mm posterior stabilized rotating platform insert is placed into the tibial tray.      The wound is copiously irrigated with saline solution and the extensor mechanism closed over a hemovac drain with #1 V-loc suture. The tourniquet is released for a total tourniquet time of 34  minutes. Flexion against gravity is 140 degrees and the patella tracks normally. Subcutaneous tissue is closed with 2.0 vicryl and subcuticular with running 4.0 Monocryl. The incision is cleaned and dried and steri-strips and a bulky sterile dressing are applied. The limb is placed into a knee immobilizer and the patient is awakened and transported to recovery in stable condition.      Please note that a surgical assistant was a medical necessity for this procedure in order to perform it in a safe and expeditious manner. Surgical assistant was necessary to retract the ligaments and vital neurovascular structures to prevent injury to them and also necessary for proper positioning of the limb to allow for anatomic placement of the prosthesis.   Lawrence Wheeler Lawrence Zammit, MD    12/20/2014, 11:21 AM

## 2014-12-21 ENCOUNTER — Encounter (HOSPITAL_COMMUNITY): Payer: Self-pay | Admitting: Orthopedic Surgery

## 2014-12-21 LAB — BASIC METABOLIC PANEL
Anion gap: 7 (ref 5–15)
BUN: 14 mg/dL (ref 6–23)
CALCIUM: 8.5 mg/dL (ref 8.4–10.5)
CO2: 27 mmol/L (ref 19–32)
Chloride: 104 mmol/L (ref 96–112)
Creatinine, Ser: 1.02 mg/dL (ref 0.50–1.35)
GFR calc non Af Amer: 74 mL/min — ABNORMAL LOW (ref >90)
GFR, EST AFRICAN AMERICAN: 86 mL/min — AB (ref >90)
GLUCOSE: 150 mg/dL — AB (ref 70–99)
Potassium: 5.1 mmol/L (ref 3.5–5.1)
Sodium: 138 mmol/L (ref 135–145)

## 2014-12-21 LAB — CBC
HEMATOCRIT: 41.4 % (ref 39.0–52.0)
Hemoglobin: 14 g/dL (ref 13.0–17.0)
MCH: 33.7 pg (ref 26.0–34.0)
MCHC: 33.8 g/dL (ref 30.0–36.0)
MCV: 99.8 fL (ref 78.0–100.0)
Platelets: 217 10*3/uL (ref 150–400)
RBC: 4.15 MIL/uL — ABNORMAL LOW (ref 4.22–5.81)
RDW: 12.9 % (ref 11.5–15.5)
WBC: 14 10*3/uL — ABNORMAL HIGH (ref 4.0–10.5)

## 2014-12-21 MED ORDER — METHOCARBAMOL 500 MG PO TABS: 500.0000 mg | ORAL_TABLET | Freq: Four times a day (QID) | ORAL | Status: DC | PRN

## 2014-12-21 MED ORDER — RIVAROXABAN 10 MG PO TABS
10.0000 mg | ORAL_TABLET | Freq: Every day | ORAL | Status: DC
Start: 2014-12-21 — End: 2015-08-30

## 2014-12-21 MED ORDER — TRAMADOL HCL 50 MG PO TABS: 50.0000 mg | ORAL_TABLET | Freq: Four times a day (QID) | ORAL | Status: DC | PRN

## 2014-12-21 MED ORDER — OXYCODONE HCL 5 MG PO TABS
5.0000 mg | ORAL_TABLET | ORAL | Status: DC | PRN
Start: 2014-12-21 — End: 2015-08-30

## 2014-12-21 NOTE — Discharge Instructions (Addendum)
° °Dr. Frank Aluisio °Total Joint Specialist °[facility] °[address] °[place], [place] [address] °[phone] ° °TOTAL KNEE REPLACEMENT POSTOPERATIVE DIRECTIONS ° ° ° °Knee Rehabilitation, Guidelines Following Surgery  °Results after knee surgery are often greatly improved when you follow the exercise, range of motion and muscle strengthening exercises prescribed by your doctor. Safety measures are also important to protect the knee from further injury. Any time any of these exercises cause you to have increased pain or swelling in your knee joint, decrease the amount until you are comfortable again and slowly increase them. If you have problems or questions, call your caregiver or physical therapist for advice.  ° °HOME CARE INSTRUCTIONS  °Remove items at home which could result in a fall. This includes throw rugs or furniture in walking pathways.  °Continue medications as instructed at time of discharge. °You may have some home medications which will be placed on hold until you complete the course of blood thinner medication.  °You may start showering once you are discharged home but do not submerge the incision under water. Just pat the incision dry and apply a dry gauze dressing on daily. °Walk with walker as instructed.  °You may resume a sexual relationship in one month or when given the OK by  your doctor.  °· Use walker as long as suggested by your caregivers. °· Avoid periods of inactivity such as sitting longer than an hour when not asleep. This helps prevent blood clots.  °You may put full weight on your legs and walk as much as is comfortable.  °You may return to work once you are cleared by your doctor.  °Do not drive a car for 6 weeks or until released by you surgeon.  °· Do not drive while taking narcotics.  °Wear the elastic stockings for three weeks following surgery during the day but you may remove then at night. °Make sure you keep all of your appointments after your  operation with all of your doctors and caregivers. You should call the office at the above phone number and make an appointment for approximately two weeks after the date of your surgery. °Change the dressing daily and reapply a dry dressing each time. °Please pick up a stool softener and laxative for home use as long as you are requiring pain medications. °· ICE to the affected knee every three hours for 30 minutes at a time and then as needed for pain and swelling.  Continue to use ice on the knee for pain and swelling from surgery. You may notice swelling that will progress down to the foot and ankle.  This is normal after surgery.  Elevate the leg when you are not up walking on it.   °It is important for you to complete the blood thinner medication as prescribed by your doctor. °· Continue to use the breathing machine which will help keep your temperature down.  It is common for your temperature to cycle up and down following surgery, especially at night when you are not up moving around and exerting yourself.  The breathing machine keeps your lungs expanded and your temperature down. ° °RANGE OF MOTION AND STRENGTHENING EXERCISES  °Rehabilitation of the knee is important following a knee injury or an operation. After just a few days of immobilization, the muscles of the thigh which control the knee become weakened and shrink (atrophy). Knee exercises are designed to build up the tone and strength of the thigh muscles and to improve knee   motion. Often times heat used for twenty to thirty minutes before working out will loosen up your tissues and help with improving the range of motion but do not use heat for the first two weeks following surgery. These exercises can be done on a training (exercise) mat, on the floor, on a table or on a bed. Use what ever works the best and is most comfortable for you Knee exercises include:  °Leg Lifts - While your knee is still immobilized in a splint or cast, you can do  straight leg raises. Lift the leg to 60 degrees, hold for 3 sec, and slowly lower the leg. Repeat 10-20 times 2-3 times daily. Perform this exercise against resistance later as your knee gets better.  °Quad and Hamstring Sets - Tighten up the muscle on the front of the thigh (Quad) and hold for 5-10 sec. Repeat this 10-20 times hourly. Hamstring sets are done by pushing the foot backward against an object and holding for 5-10 sec. Repeat as with quad sets.  °A rehabilitation program following serious knee injuries can speed recovery and prevent re-injury in the future due to weakened muscles. Contact your doctor or a physical therapist for more information on knee rehabilitation.  ° °SKILLED REHAB INSTRUCTIONS: °If the patient is transferred to a skilled rehab facility following release from the hospital, a list of the current medications will be sent to the facility for the patient to continue.  When discharged from the skilled rehab facility, please have the facility set up the patient's Home Health Physical Therapy prior to being released. Also, the skilled facility will be responsible for providing the patient with their medications at time of release from the facility to include their pain medication, the muscle relaxants, and their blood thinner medication. If the patient is still at the rehab facility at time of the two week follow up appointment, the skilled rehab facility will also need to assist the patient in arranging follow up appointment in our office and any transportation needs. ° °MAKE SURE YOU:  °Understand these instructions.  °Will watch your condition.  °Will get help right away if you are not doing well or get worse.  ° ° °Pick up stool softner and laxative for home use following surgery while on pain medications. °Do not submerge incision under water. °Please use good hand washing techniques while changing dressing each day. °May shower starting three days after surgery. °Please use a clean  towel to pat the incision dry following showers. °Continue to use ice for pain and swelling after surgery. °Do not use any lotions or creams on the incision until instructed by your surgeon. ° °Take Xarelto for two and a half more weeks, then discontinue Xarelto. °Once the patient has completed the blood thinner regimen, then take a Baby 81 mg Aspirin daily for three more weeks. ° °Postoperative Constipation Protocol ° °Constipation - defined medically as fewer than three stools per week and severe constipation as less than one stool per week. ° °One of the most common issues patients have following surgery is constipation.  Even if you have a regular bowel pattern at home, your normal regimen is likely to be disrupted due to multiple reasons following surgery.  Combination of anesthesia, postoperative narcotics, change in appetite and fluid intake all can affect your bowels.  In order to avoid complications following surgery, here are some recommendations in order to help you during your recovery period. ° °Colace (docusate) - Pick up an over-the-counter form of   Colace or another stool softener and take twice a day as long as you are requiring postoperative pain medications.  Take with a full glass of water daily.  If you experience loose stools or diarrhea, hold the colace until you stool forms back up.  If your symptoms do not get better within 1 week or if they get worse, check with your doctor.  Dulcolax (bisacodyl) - Pick up over-the-counter and take as directed by the product packaging as needed to assist with the movement of your bowels.  Take with a full glass of water.  Use this product as needed if not relieved by Colace only.   MiraLax (polyethylene glycol) - Pick up over-the-counter to have on hand.  MiraLax is a solution that will increase the amount of water in your bowels to assist with bowel movements.  Take as directed and can mix with a glass of water, juice, soda, coffee, or tea.  Take if you  go more than two days without a movement. Do not use MiraLax more than once per day. Call your doctor if you are still constipated or irregular after using this medication for 7 days in a row.  If you continue to have problems with postoperative constipation, please contact the office for further assistance and recommendations.  If you experience "the worst abdominal pain ever" or develop nausea or vomiting, please contact the office immediatly for further recommendations for treatment.   Information on my medicine - XARELTO (Rivaroxaban)  This medication education was reviewed with me or my healthcare representative as part of my discharge preparation.  The pharmacist that spoke with me during my hospital stay was:  [facility]  Why was Xarelto prescribed for you? Xarelto was prescribed for you to reduce the risk of blood clots forming after orthopedic surgery. The medical term for these abnormal blood clots is venous thromboembolism (VTE).  What do you need to know about xarelto ? Take your Xarelto ONCE DAILY at the same time every day. You may take it either with or without food.  If you have difficulty swallowing the tablet whole, you may crush it and mix in applesauce just prior to taking your dose.  Take Xarelto exactly as prescribed by your doctor and DO NOT stop taking Xarelto without talking to the doctor who prescribed the medication.  Stopping without other VTE prevention medication to take the place of Xarelto may increase your risk of developing a clot.  After discharge, you should have regular check-up appointments with your healthcare provider that is prescribing your Xarelto.    What do you do if you miss a dose? If you miss a dose, take it as soon as you remember on the same day then continue your regularly scheduled once daily regimen the next day. Do not take two doses of Xarelto on the same day.   Important Safety Information A possible side effect  of Xarelto is bleeding. You should call your healthcare provider right away if you experience any of the following: ? Bleeding from an injury or your nose that does not stop. ? Unusual colored urine (red or dark brown) or unusual colored stools (red or black). ? Unusual bruising for unknown reasons. ? A serious fall or if you hit your head (even if there is no bleeding).  Some medicines may interact with Xarelto and might increase your risk of bleeding while on Xarelto. To help avoid this, consult your healthcare provider or pharmacist prior to using any new  prescription or non-prescription medications, including herbals, vitamins, non-steroidal anti-inflammatory drugs (NSAIDs) and supplements.  This website has more information on Xarelto: [email or web address]

## 2014-12-21 NOTE — Progress Notes (Signed)
Physical Therapy Treatment Patient Details Name: Lawrence Wheeler MRN: [id number] DOB: 1946-09-14 Today's Date: 12/21/2014    History of Present Illness 69 yo male s/p R TKA 12/20/14    PT Comments    Progressing well with mobility.  Follow Up Recommendations  Home health PT     Equipment Recommendations  Rolling walker with 5" wheels    Recommendations for Other Services OT consult     Precautions / Restrictions Precautions Precautions: Knee;Fall Required Braces or Orthoses: Knee Immobilizer - Right Knee Immobilizer - Right: Discontinue once straight leg raise with < 10 degree lag (able to SLR 12/21/14) Restrictions Weight Bearing Restrictions: No RLE Weight Bearing: Weight bearing as tolerated    Mobility  Bed Mobility Overal bed mobility: Needs Assistance Bed Mobility: Supine to Sit     Supine to sit: Min assist     General bed mobility comments: pt oob in recliner  Transfers Overall transfer level: Needs assistance Equipment used: Rolling walker (2 wheeled) Transfers: Sit to/from Stand Sit to Stand: Min guard         General transfer comment: close guard for safety. VCs safety, hand placement  Ambulation/Gait Ambulation/Gait assistance: Min guard Ambulation Distance (Feet): 115 Feet Assistive device: Rolling walker (2 wheeled) Gait Pattern/deviations: Step-to pattern;Decreased stride length;Antalgic     General Gait Details: close guard for safety.    Stairs            Wheelchair Mobility    Modified Rankin (Stroke Patients Only)       Balance                                    Cognition Arousal/Alertness: Awake/alert Behavior During Therapy: WFL for tasks assessed/performed Overall Cognitive Status: Within Functional Limits for tasks assessed                      Exercises Total Joint Exercises Ankle Circles/Pumps: AROM;Both;10 reps;Seated Quad Sets: AROM;Both;10 reps;Seated Heel Slides: AAROM;Right;10  reps;Seated Hip ABduction/ADduction: AROM;Right;10 reps;Seated Straight Leg Raises: AROM;Right;10 reps;Seated Goniometric ROM: 10-50 degrees    General Comments General comments (skin integrity, edema, etc.): min assist to stand and manage gown.      Pertinent Vitals/Pain Pain Assessment: 0-10 Pain Score: 5  Pain Location: R knee Pain Descriptors / Indicators: Aching;Sore Pain Intervention(s): Monitored during session;Repositioned;Ice applied    Home Living Family/patient expects to be discharged to:: Private residence Living Arrangements: Spouse/significant other Available Help at Discharge: Family Type of Home: House Home Access: Stairs to enter Entrance Stairs-Rails: None Home Layout: Two level;Able to live on main level with bedroom/bathroom Home Equipment: None;Adaptive equipment      Prior Function Level of Independence: Independent          PT Goals (current goals can now be found in the care plan section) Acute Rehab PT Goals Patient Stated Goal: home. regain independence Progress towards PT goals: Progressing toward goals    Frequency  7X/week    PT Plan Current plan remains appropriate    Co-evaluation             End of Session Equipment Utilized During Treatment: Gait belt Activity Tolerance: Patient tolerated treatment well Patient left: in chair;with call bell/phone within reach;with family/visitor present     Time: 9242-6834 PT Time Calculation (min) (ACUTE ONLY): 24 min  Charges:  $Gait Training: 8-22 mins $Therapeutic Exercise: 8-22 mins  G Codes:      Weston Anna, MPT Pager: [phone]

## 2014-12-21 NOTE — Evaluation (Signed)
Occupational Therapy Evaluation Patient Details Name: Lawrence Wheeler MRN: [id number] DOB: 12-22-1945 Today's Date: 12/21/2014    History of Present Illness 69 yo male s/p R TKA 12/20/14   Clinical Impression   Pt up to practice 3in1 transfer with walker. Pt needs min cues for proper walker sequence and not stepping too close to walker. Wife present for education. Will follow on acute to progress ADLs.    Follow Up Recommendations  No OT follow up;Supervision/Assistance - 24 hour    Equipment Recommendations  3 in 1 bedside comode    Recommendations for Other Services       Precautions / Restrictions Precautions Precautions: Knee;Fall Required Braces or Orthoses: Knee Immobilizer - Right Knee Immobilizer - Right: Discontinue once straight leg raise with < 10 degree lag Restrictions Weight Bearing Restrictions: No RLE Weight Bearing: Weight bearing as tolerated      Mobility Bed Mobility Overal bed mobility: Needs Assistance Bed Mobility: Supine to Sit     Supine to sit: Min assist     General bed mobility comments: min assist to support R LE off bed.  Transfers Overall transfer level: Needs assistance Equipment used: Rolling walker (2 wheeled) Transfers: Sit to/from Stand Sit to Stand: Min assist         General transfer comment: verbal cues for hand placement and LE management.     Balance                                            ADL Overall ADL's : Needs assistance/impaired Eating/Feeding: Independent;Sitting   Grooming: Wash/dry hands;Set up;Sitting   Upper Body Bathing: Set up;Sitting   Lower Body Bathing: Moderate assistance;Sit to/from stand   Upper Body Dressing : Set up;Sitting   Lower Body Dressing: Maximal assistance;Sit to/from stand   Toilet Transfer: Minimal assistance;Ambulation;BSC;RW   Toileting- Clothing Manipulation and Hygiene: Minimal assistance;Sit to/from stand         General ADL Comments:  educated pt and wife on tub transfer bench option and pt's wife did see one at [facility] but states she wasnt sure if back was adjustable to have the bench face the other direction in shower. Explained that the back is usually able to come off and attach to the opposite side. Demonstrated how to use tubbench and they will consider getting one versus sponge bathing initially.  Explained that 3in1 is adjustable and how to adjust for appropriate height. Wife will assit with LB self care.      Vision                     Perception     Praxis      Pertinent Vitals/Pain Pain Assessment: 0-10 Pain Score: 4  Pain Location: R knee Pain Descriptors / Indicators: Aching Pain Intervention(s): Repositioned;Ice applied     Hand Dominance     Extremity/Trunk Assessment Upper Extremity Assessment Upper Extremity Assessment: Overall WFL for tasks assessed           Communication Communication Communication: No difficulties   Cognition Arousal/Alertness: Awake/alert Behavior During Therapy: WFL for tasks assessed/performed Overall Cognitive Status: Within Functional Limits for tasks assessed                     General Comments       Exercises       Shoulder Instructions  Home Living Family/patient expects to be discharged to:: Private residence Living Arrangements: Spouse/significant other Available Help at Discharge: Family Type of Home: House Home Access: Stairs to enter [employer] of Steps: 2 Entrance Stairs-Rails: None Home Layout: Two level;Able to live on main level with bedroom/bathroom Alternate Level Stairs-Number of Steps: 1 flight   Bathroom Shower/Tub: Teacher, early years/pre: Standard     Home Equipment: None;Adaptive equipment Adaptive Equipment: Reacher;Long-handled sponge        Prior Functioning/Environment Level of Independence: Independent             OT Diagnosis: Generalized weakness   OT Problem  List: Decreased strength;Decreased knowledge of use of DME or AE   OT Treatment/Interventions: Self-care/ADL training;Patient/family education;Therapeutic activities;DME and/or AE instruction    OT Goals(Current goals can be found in the care plan section) Acute Rehab OT Goals Patient Stated Goal: home. regain independence OT Goal Formulation: With patient/family Time For Goal Achievement: 12/28/14 Potential to Achieve Goals: Good  OT Frequency: Min 2X/week   Barriers to D/C:            Co-evaluation              End of Session Equipment Utilized During Treatment: Gait belt;Right knee immobilizer  Activity Tolerance: Patient tolerated treatment well Patient left: in chair;with call bell/phone within reach;with family/visitor present   Time: 5110-2111 OT Time Calculation (min): 36 min Charges:  OT General Charges $OT Visit: 1 Procedure OT Evaluation $Initial OT Evaluation Tier I: 1 Procedure OT Treatments $Self Care/Home Management : 8-22 mins $Therapeutic Activity: 8-22 mins G-Codes:    Jules Schick  [phone] 12/21/2014, 11:32 AM

## 2014-12-21 NOTE — Progress Notes (Signed)
Physical Therapy Treatment Patient Details Name: MAKS CAVALLERO MRN: [id number] DOB: 28-Nov-1945 Today's Date: 12/21/2014    History of Present Illness 69 yo male s/p R TKA 12/20/14    PT Comments    Progressing well with mobility. Will plan to practice steps on tomorrow.   Follow Up Recommendations  Home health PT     Equipment Recommendations  Rolling walker with 5" wheels    Recommendations for Other Services OT consult     Precautions / Restrictions Precautions Precautions: Knee;Fall Required Braces or Orthoses: Knee Immobilizer - Right Knee Immobilizer - Right: Discontinue once straight leg raise with < 10 degree lag Restrictions Weight Bearing Restrictions: No RLE Weight Bearing: Weight bearing as tolerated    Mobility  Bed Mobility   Bed Mobility: Sit to Supine       Sit to supine: Min assist   General bed mobility comments: assist for R LE  Transfers Overall transfer level: Needs assistance Equipment used: Rolling walker (2 wheeled) Transfers: Sit to/from Stand Sit to Stand: Min guard         General transfer comment: close guard for safety. VCs safety, hand placement  Ambulation/Gait Ambulation/Gait assistance: Min guard Ambulation Distance (Feet): 135 Feet Assistive device: Rolling walker (2 wheeled) Gait Pattern/deviations: Step-through pattern;Decreased stride length     General Gait Details: close guard for safety.    Stairs            Wheelchair Mobility    Modified Rankin (Stroke Patients Only)       Balance                                    Cognition Arousal/Alertness: Awake/alert Behavior During Therapy: WFL for tasks assessed/performed Overall Cognitive Status: Within Functional Limits for tasks assessed                      Exercises Total Joint Exercises Ankle Circles/Pumps: AROM;Both;10 reps;Seated Quad Sets: AROM;Both;10 reps;Seated Heel Slides: AAROM;Right;10 reps;Seated Hip  ABduction/ADduction: AROM;Right;10 reps;Seated Straight Leg Raises: AROM;Right;10 reps;Seated Goniometric ROM: 10-50 degrees    General Comments        Pertinent Vitals/Pain Pain Assessment: 0-10 Pain Score: 5  Pain Location: R knee/thigh area Pain Descriptors / Indicators: Aching;Sore Pain Intervention(s): Monitored during session    Home Living                      Prior Function            PT Goals (current goals can now be found in the care plan section) Progress towards PT goals: Progressing toward goals    Frequency  7X/week    PT Plan Current plan remains appropriate    Co-evaluation             End of Session Equipment Utilized During Treatment: Gait belt Activity Tolerance: Patient tolerated treatment well Patient left: in bed;with call bell/phone within reach     Time: 1442-1455 PT Time Calculation (min) (ACUTE ONLY): 13 min  Charges:  $Gait Training: 8-22 mins $Therapeutic Exercise: 8-22 mins                    G Codes:      Weston Anna, MPT Pager: [phone]

## 2014-12-21 NOTE — Care Management Note (Signed)
    Page 1 of 2   12/21/2014     3:08:11 PM CARE MANAGEMENT NOTE 12/21/2014  Patient:  Lawrence Wheeler, Lawrence Wheeler   Account Number:  [id number]  Date Initiated:  12/21/2014  Documentation initiated by:  [facility]  Subjective/Objective Assessment:   adm:  RIGHT TOTAL KNEE ARTHROPLASTY (Right)     Action/Plan:   discharge planning   Anticipated DC Date:  12/22/2014   Anticipated DC Plan:  [place]  CM consult      [facility] Choice  HOME HEALTH   Choice offered to / List presented to:  C-1 Patient   DME arranged  3-N-1  [place]      DME agency  [place] arranged  HH-2 PT      [place].   Status of service:  Completed, signed off Medicare Important Message given?   (If response is "NO", the following Medicare IM given date fields will be blank) Date Medicare IM given:   Medicare IM given by:   Date Additional Medicare IM given:   Additional Medicare IM given by:    Discharge Disposition:  [place]  Per UR Regulation:    If discussed at Long Length of Stay Meetings, dates discussed:    Comments:  12/21/14 10:00 CM met with pt in room to offer choice of home health agency.  Pt chooses [facility] to render HHPT. Address and contact information verified by pt.  CM called [employer] and spoke with Gerald Stabs who reqested I fax facesheet, orders, PT EVAL, face to face to [phone] attn: Tenisha.  CM faxed aforementioned information per Gerald Stabs' request.  DME request if for delivery to room today.  Referral called to [facility] rep, Kristen for HHPT.  No other CM needs were communicated. Mariane Masters, BSN, CM [phone].

## 2014-12-21 NOTE — Progress Notes (Signed)
   Subjective: 1 Day Post-Op Procedure(s) (LRB): RIGHT TOTAL KNEE ARTHROPLASTY (Right) Patient reports pain as mild.   Patient seen in rounds with Dr. Wynelle Link. Wife in room. Patient is well, and has had no acute complaints or problems We will rseume therapy today. He walked 50 feet the day of surgery. Plan is to go Home after hospital stay.  Objective: Vital signs in last 24 hours: Temp:  [97.3 F (36.3 C)-98.4 F (36.9 C)] 98 F (36.7 C) (01/26 0520) Pulse Rate:  [62-88] 63 (01/26 0520) Resp:  [11-18] 16 (01/26 0520) BP: (113-145)/(62-93) 126/79 mmHg (01/26 0520) SpO2:  [94 %-100 %] 97 % (01/26 0520) Weight:  [124.739 kg (275 lb)] 124.739 kg (275 lb) (01/25 0804)  Intake/Output from previous day:  Intake/Output Summary (Last 24 hours) at 12/21/14 0759 Last data filed at 12/21/14 2010  Gross per 24 hour  Intake 4232.92 ml  Output   3420 ml  Net 812.92 ml    Intake/Output this shift: UOP 975 since MN  Labs:  Recent Labs  12/21/14 0516  HGB 14.0    Recent Labs  12/21/14 0516  WBC 14.0*  RBC 4.15*  HCT 41.4  PLT 217    Recent Labs  12/21/14 0516  NA 138  K 5.1  CL 104  CO2 27  BUN 14  CREATININE 1.02  GLUCOSE 150*  CALCIUM 8.5   No results for input(s): LABPT, INR in the last 72 hours.  EXAM General - Patient is Alert, Appropriate and Oriented Extremity - Neurovascular intact Sensation intact distally Dorsiflexion/Plantar flexion intact Dressing - dressing C/D/I Motor Function - intact, moving foot and toes well on exam.  Hemovac pulled without difficulty.  Past Medical History  Diagnosis Date  . GERD (gastroesophageal reflux disease)   . Arthritis     Assessment/Plan: 1 Day Post-Op Procedure(s) (LRB): RIGHT TOTAL KNEE ARTHROPLASTY (Right) Principal Problem:   OA (osteoarthritis) of knee  Estimated body mass index is 41.82 kg/(m^2) as calculated from the following:   Height as of this encounter: 5\' 8"  (1.727 m).   Weight as of this  encounter: 124.739 kg (275 lb). Advance diet Up with therapy Plan for discharge tomorrow Discharge home with home health  DVT Prophylaxis - Xarelto Weight-Bearing as tolerated to right leg D/C O2 and Pulse OX and try on Room Air  [name], PA-C Orthopaedic Surgery 12/21/2014, 7:59 AM

## 2014-12-21 NOTE — Discharge Summary (Signed)
Physician Discharge Summary   Patient ID: Lawrence Wheeler MRN: [id number] DOB/AGE: Mar 09, 1946 69 y.o.  Admit date: 12/20/2014 Discharge date: 12/22/2014  Primary Diagnosis:  Osteoarthritis Right knee(s) Admission Diagnoses:  Past Medical History  Diagnosis Date  . GERD (gastroesophageal reflux disease)   . Arthritis    Discharge Diagnoses:   Principal Problem:   OA (osteoarthritis) of knee  Estimated body mass index is 41.82 kg/(m^2) as calculated from the following:   Height as of this encounter: $RemoveBeforeD'5\' 8"'[id number]$  (1.727 m).   Weight as of this encounter: 124.739 kg (275 lb).  Procedure:  Procedure(s) (LRB): RIGHT TOTAL KNEE ARTHROPLASTY (Right)   Consults: None  HPI: Lawrence Wheeler is a 69 y.o. year old male with end stage OA of his right knee with progressively worsening pain and dysfunction. He has constant pain, with activity and at rest and significant functional deficits with difficulties even with ADLs. He has had extensive non-op management including analgesics, injections of cortisone, and home exercise program, but remains in significant pain with significant dysfunction. Radiographs show bone on bone arthritis medial and patellofemoral. He presents now for right Total Knee Arthroplasty.   Laboratory Data: Admission on 12/20/2014, Discharged on 12/22/2014  Component Date Value Ref Range Status  . ABO/RH(D) 12/20/2014 O POS   Final  . Antibody Screen 12/20/2014 NEG   Final  . Sample Expiration 12/20/2014 12/23/2014   Final  . ABO/RH(D) 12/20/2014 O POS   Final  . WBC 12/21/2014 14.0* 4.0 - 10.5 K/uL Final  . RBC 12/21/2014 4.15* 4.22 - 5.81 MIL/uL Final  . Hemoglobin 12/21/2014 14.0  13.0 - 17.0 g/dL Final  . HCT 12/21/2014 41.4  39.0 - 52.0 % Final  . MCV 12/21/2014 99.8  78.0 - 100.0 fL Final  . MCH 12/21/2014 33.7  26.0 - 34.0 pg Final  . MCHC 12/21/2014 33.8  30.0 - 36.0 g/dL Final  . RDW 12/21/2014 12.9  11.5 - 15.5 % Final  . Platelets 12/21/2014 217  150 - 400  K/uL Final  . Sodium 12/21/2014 138  135 - 145 mmol/L Final  . Potassium 12/21/2014 5.1  3.5 - 5.1 mmol/L Final   SLIGHT HEMOLYSIS  . Chloride 12/21/2014 104  96 - 112 mmol/L Final  . CO2 12/21/2014 27  19 - 32 mmol/L Final  . Glucose, Bld 12/21/2014 150* 70 - 99 mg/dL Final  . BUN 12/21/2014 14  6 - 23 mg/dL Final  . Creatinine, Ser 12/21/2014 1.02  0.50 - 1.35 mg/dL Final  . Calcium 12/21/2014 8.5  8.4 - 10.5 mg/dL Final  . GFR calc non Af Amer 12/21/2014 74* >90 mL/min Final  . GFR calc Af Amer 12/21/2014 86* >90 mL/min Final   Comment: (NOTE) The eGFR has been calculated using the CKD EPI equation. This calculation has not been validated in all clinical situations. eGFR's persistently <90 mL/min signify possible Chronic Kidney Disease.   . Anion gap 12/21/2014 7  5 - 15 Final  . WBC 12/22/2014 16.3* 4.0 - 10.5 K/uL Final  . RBC 12/22/2014 3.85* 4.22 - 5.81 MIL/uL Final  . Hemoglobin 12/22/2014 12.8* 13.0 - 17.0 g/dL Final  . HCT 12/22/2014 38.1* 39.0 - 52.0 % Final  . MCV 12/22/2014 99.0  78.0 - 100.0 fL Final  . MCH 12/22/2014 33.2  26.0 - 34.0 pg Final  . MCHC 12/22/2014 33.6  30.0 - 36.0 g/dL Final  . RDW 12/22/2014 13.0  11.5 - 15.5 % Final  . Platelets 12/22/2014 220  150 -  400 K/uL Final  . Sodium 12/22/2014 138  135 - 145 mmol/L Final  . Potassium 12/22/2014 4.1  3.5 - 5.1 mmol/L Final   DELTA CHECK NOTED  . Chloride 12/22/2014 103  96 - 112 mmol/L Final  . CO2 12/22/2014 28  19 - 32 mmol/L Final  . Glucose, Bld 12/22/2014 149* 70 - 99 mg/dL Final  . BUN 12/22/2014 16  6 - 23 mg/dL Final  . Creatinine, Ser 12/22/2014 0.99  0.50 - 1.35 mg/dL Final  . Calcium 12/22/2014 8.4  8.4 - 10.5 mg/dL Final  . GFR calc non Af Amer 12/22/2014 83* >90 mL/min Final  . GFR calc Af Amer 12/22/2014 >90  >90 mL/min Final   Comment: (NOTE) The eGFR has been calculated using the CKD EPI equation. This calculation has not been validated in all clinical situations. eGFR's persistently  <90 mL/min signify possible Chronic Kidney Disease.   Georgiann Hahn gap 12/22/2014 7  5 - 15 Final  Hospital Outpatient Visit on 12/15/2014  Component Date Value Ref Range Status  . aPTT 12/15/2014 24  24 - 37 seconds Final  . WBC 12/15/2014 6.8  4.0 - 10.5 K/uL Final  . RBC 12/15/2014 4.59  4.22 - 5.81 MIL/uL Final  . Hemoglobin 12/15/2014 15.5  13.0 - 17.0 g/dL Final  . HCT 12/15/2014 45.3  39.0 - 52.0 % Final  . MCV 12/15/2014 98.7  78.0 - 100.0 fL Final  . MCH 12/15/2014 33.8  26.0 - 34.0 pg Final  . MCHC 12/15/2014 34.2  30.0 - 36.0 g/dL Final  . RDW 12/15/2014 13.2  11.5 - 15.5 % Final  . Platelets 12/15/2014 193  150 - 400 K/uL Final  . Sodium 12/15/2014 138  135 - 145 mmol/L Final   Please note change in reference range.  . Potassium 12/15/2014 4.4  3.5 - 5.1 mmol/L Final   Please note change in reference range.  . Chloride 12/15/2014 103  96 - 112 mEq/L Final  . CO2 12/15/2014 28  19 - 32 mmol/L Final  . Glucose, Bld 12/15/2014 104* 70 - 99 mg/dL Final  . BUN 12/15/2014 15  6 - 23 mg/dL Final  . Creatinine, Ser 12/15/2014 1.00  0.50 - 1.35 mg/dL Final  . Calcium 12/15/2014 9.0  8.4 - 10.5 mg/dL Final  . Total Protein 12/15/2014 7.5  6.0 - 8.3 g/dL Final  . Albumin 12/15/2014 4.3  3.5 - 5.2 g/dL Final  . AST 12/15/2014 42* 0 - 37 U/L Final  . ALT 12/15/2014 55* 0 - 53 U/L Final  . Alkaline Phosphatase 12/15/2014 58  39 - 117 U/L Final  . Total Bilirubin 12/15/2014 2.0* 0.3 - 1.2 mg/dL Final  . GFR calc non Af Amer 12/15/2014 76* >90 mL/min Final  . GFR calc Af Amer 12/15/2014 88* >90 mL/min Final   Comment: (NOTE) The eGFR has been calculated using the CKD EPI equation. This calculation has not been validated in all clinical situations. eGFR's persistently <90 mL/min signify possible Chronic Kidney Disease.   . Anion gap 12/15/2014 7  5 - 15 Final  . Prothrombin Time 12/15/2014 13.3  11.6 - 15.2 seconds Final  . INR 12/15/2014 1.00  0.00 - 1.49 Final  . Color, Urine  12/15/2014 AMBER* YELLOW Final   BIOCHEMICALS MAY BE AFFECTED BY COLOR  . APPearance 12/15/2014 CLEAR  CLEAR Final  . Specific Gravity, Urine 12/15/2014 1.022  1.005 - 1.030 Final  . pH 12/15/2014 5.5  5.0 - 8.0 Final  .  Glucose, UA 12/15/2014 NEGATIVE  NEGATIVE mg/dL Final  . Hgb urine dipstick 12/15/2014 NEGATIVE  NEGATIVE Final  . Bilirubin Urine 12/15/2014 SMALL* NEGATIVE Final  . Ketones, ur 12/15/2014 NEGATIVE  NEGATIVE mg/dL Final  . Protein, ur 11/35/6527 NEGATIVE  NEGATIVE mg/dL Final  . Urobilinogen, UA 12/15/2014 1.0  0.0 - 1.0 mg/dL Final  . Nitrite 80/24/4329 NEGATIVE  NEGATIVE Final  . Leukocytes, UA 12/15/2014 NEGATIVE  NEGATIVE Final   MICROSCOPIC NOT DONE ON URINES WITH NEGATIVE PROTEIN, BLOOD, LEUKOCYTES, NITRITE, OR GLUCOSE <1000 mg/dL.  Marland Kitchen MRSA, PCR 12/15/2014 NEGATIVE  NEGATIVE Final  . Staphylococcus aureus 12/15/2014 NEGATIVE  NEGATIVE Final   Comment:        The Xpert SA Assay (FDA approved for NASAL specimens in patients over 33 years of age), is one component of a comprehensive surveillance program.  Test performance has been validated by [facility] for patients greater than or equal to 30 year old. It is not intended to diagnose infection nor to guide or monitor treatment.      X-Rays:No results found.  EKG:No orders found for this or any previous visit.   Hospital Course: Lawrence Wheeler is a 69 y.o. who was admitted to [facility] They were brought to the operating room on 12/20/2014 and underwent Procedure(s): RIGHT TOTAL KNEE ARTHROPLASTY.  Patient tolerated the procedure well and was later transferred to the recovery room and then to the orthopaedic floor for postoperative care.  They were given PO and IV analgesics for pain control following their surgery.  They were given 24 hours of postoperative antibiotics of  Anti-infectives    Start     Dose/Rate Route Frequency Ordered Stop   12/20/14 2200  vancomycin (VANCOCIN) IVPB 1000 mg/200  mL premix     1,000 mg200 mL/hr over 60 Minutes Intravenous Every 12 hours 12/20/14 1340 12/20/14 2223   12/20/14 0600  vancomycin (VANCOCIN) 1,500 mg in sodium chloride 0.9 % 500 mL IVPB     1,500 mg250 mL/hr over 120 Minutes Intravenous On call to O.R. 12/19/14 2023 12/20/14 1131     and started on DVT prophylaxis in the form of Xarelto.   PT and OT were ordered for total joint protocol.  Discharge planning consulted to help with postop disposition and equipment needs.  Patient had a good night on the evening of surgery walking 50 feet that evening.  They started to get up OOB with therapy on day one. Hemovac drain was pulled without difficulty.  Continued to work with therapy into day two.  Dressing was changed on day two and the incision was healing well. Patient was seen in rounds and was ready to go home.  Discharge home with home health Diet - Regular diet Follow up - in 2 weeks Activity - WBAT Disposition - Home Condition Upon Discharge - Good D/C Meds - See DC Summary DVT Prophylaxis - Xarelto      Discharge Instructions    Call MD / Call 911    Complete by:  As directed   If you experience chest pain or shortness of breath, CALL 911 and be transported to the hospital emergency room.  If you develope a fever above 101 F, pus (white drainage) or increased drainage or redness at the wound, or calf pain, call your surgeon's office.     Change dressing    Complete by:  As directed   Change dressing daily with sterile 4 x 4 inch gauze dressing and apply TED hose.  Do not submerge the incision under water.     Constipation Prevention    Complete by:  As directed   Drink plenty of fluids.  Prune juice may be helpful.  You may use a stool softener, such as Colace (over the counter) 100 mg twice a day.  Use MiraLax (over the counter) for constipation as needed.     Diet - low sodium heart healthy    Complete by:  As directed      Discharge instructions    Complete by:  As directed    Pick up stool softner and laxative for home use following surgery while on pain medications. Do not submerge incision under water. Please use good hand washing techniques while changing dressing each day. May shower starting three days after surgery. Please use a clean towel to pat the incision dry following showers. Continue to use ice for pain and swelling after surgery. Do not use any lotions or creams on the incision until instructed by your surgeon.  Take Xarelto for two and a half more weeks, then discontinue Xarelto. Once the patient has completed the blood thinner regimen, then take a Baby 81 mg Aspirin daily for three more weeks.  Postoperative Constipation Protocol  Constipation - defined medically as fewer than three stools per week and severe constipation as less than one stool per week.  One of the most common issues patients have following surgery is constipation.  Even if you have a regular bowel pattern at home, your normal regimen is likely to be disrupted due to multiple reasons following surgery.  Combination of anesthesia, postoperative narcotics, change in appetite and fluid intake all can affect your bowels.  In order to avoid complications following surgery, here are some recommendations in order to help you during your recovery period.  Colace (docusate) - Pick up an over-the-counter form of Colace or another stool softener and take twice a day as long as you are requiring postoperative pain medications.  Take with a full glass of water daily.  If you experience loose stools or diarrhea, hold the colace until you stool forms back up.  If your symptoms do not get better within 1 week or if they get worse, check with your doctor.  Dulcolax (bisacodyl) - Pick up over-the-counter and take as directed by the product packaging as needed to assist with the movement of your bowels.  Take with a full glass of water.  Use this product as needed if not relieved by Colace only.    MiraLax (polyethylene glycol) - Pick up over-the-counter to have on hand.  MiraLax is a solution that will increase the amount of water in your bowels to assist with bowel movements.  Take as directed and can mix with a glass of water, juice, soda, coffee, or tea.  Take if you go more than two days without a movement. Do not use MiraLax more than once per day. Call your doctor if you are still constipated or irregular after using this medication for 7 days in a row.  If you continue to have problems with postoperative constipation, please contact the office for further assistance and recommendations.  If you experience "the worst abdominal pain ever" or develop nausea or vomiting, please contact the office immediatly for further recommendations for treatment.     Do not put a pillow under the knee. Place it under the heel.    Complete by:  As directed      Do not sit on low chairs,  stoools or toilet seats, as it may be difficult to get up from low surfaces    Complete by:  As directed      Driving restrictions    Complete by:  As directed   No driving until released by the physician.     Increase activity slowly as tolerated    Complete by:  As directed      Lifting restrictions    Complete by:  As directed   No lifting until released by the physician.     Patient may shower    Complete by:  As directed   You may shower without a dressing once there is no drainage.  Do not wash over the wound.  If drainage remains, do not shower until drainage stops.     TED hose    Complete by:  As directed   Use stockings (TED hose) for 3 weeks on both leg(s).  You may remove them at night for sleeping.     Weight bearing as tolerated    Complete by:  As directed   Laterality:  right  Extremity:  Lower            Medication List    STOP taking these medications        ibuprofen 200 MG tablet  Commonly known as:  ADVIL,MOTRIN      TAKE these medications        atorvastatin 20 MG tablet   Commonly known as:  LIPITOR  Take 20 mg by mouth daily.     methocarbamol 500 MG tablet  Commonly known as:  ROBAXIN  Take 1 tablet (500 mg total) by mouth every 6 (six) hours as needed for muscle spasms.     oxyCODONE 5 MG immediate release tablet  Commonly known as:  Oxy IR/ROXICODONE  Take 1-2 tablets (5-10 mg total) by mouth every 3 (three) hours as needed for breakthrough pain.     pantoprazole 40 MG tablet  Commonly known as:  PROTONIX  Take 40 mg by mouth daily.     psyllium 58.6 % powder  Commonly known as:  METAMUCIL  Take 1 packet by mouth daily as needed (Constipation).     rivaroxaban 10 MG Tabs tablet  Commonly known as:  XARELTO  - Take 1 tablet (10 mg total) by mouth daily with breakfast. Take Xarelto for two and a half more weeks, then discontinue Xarelto.  - Once the patient has completed the blood thinner regimen, then take a Baby 81 mg Aspirin daily for three more weeks.     traMADol 50 MG tablet  Commonly known as:  ULTRAM  Take 1-2 tablets (50-100 mg total) by mouth every 6 (six) hours as needed (mild pain).       Follow-up Information    Follow up with [facility].   Why:  home health physical therapy   Contact information:   [address] [place] [address] [phone]       Follow up with [facility].   Why:  3n1 and rolling walker   Contact information:   [address] [place]  [address] [phone]       Follow up with [name], MD. Schedule an appointment as soon as possible for a visit on 01/04/2015.   Specialty:  Orthopedic Surgery   Why:  Call office at [phone] tomorrow to setup appointment with Dr. Denman George information:   [address]  [address] [place] [address] [phone]       Signed: [name], PA-C Orthopaedic Surgery 12/28/2014, 11:04 AM

## 2014-12-22 LAB — CBC
HCT: 38.1 % — ABNORMAL LOW (ref 39.0–52.0)
HEMOGLOBIN: 12.8 g/dL — AB (ref 13.0–17.0)
MCH: 33.2 pg (ref 26.0–34.0)
MCHC: 33.6 g/dL (ref 30.0–36.0)
MCV: 99 fL (ref 78.0–100.0)
Platelets: 220 10*3/uL (ref 150–400)
RBC: 3.85 MIL/uL — ABNORMAL LOW (ref 4.22–5.81)
RDW: 13 % (ref 11.5–15.5)
WBC: 16.3 10*3/uL — AB (ref 4.0–10.5)

## 2014-12-22 LAB — BASIC METABOLIC PANEL
Anion gap: 7 (ref 5–15)
BUN: 16 mg/dL (ref 6–23)
CO2: 28 mmol/L (ref 19–32)
Calcium: 8.4 mg/dL (ref 8.4–10.5)
Chloride: 103 mmol/L (ref 96–112)
Creatinine, Ser: 0.99 mg/dL (ref 0.50–1.35)
GFR, EST NON AFRICAN AMERICAN: 83 mL/min — AB (ref >90)
GLUCOSE: 149 mg/dL — AB (ref 70–99)
Potassium: 4.1 mmol/L (ref 3.5–5.1)
SODIUM: 138 mmol/L (ref 135–145)

## 2014-12-22 NOTE — Progress Notes (Signed)
OT Cancellation Note  Patient Details Name: Lawrence Wheeler MRN: [id number] DOB: 1946/07/16   Cancelled Treatment:    Reason Eval/Treat Not Completed: Other (comment) Pt states he got dressed this am without difficulty and wife confirms she can assist. Pt states he has been up to 3in1 several times and feels comfortable with toilet transfers. He declines any further OT needs and is supposed to d/c today.  Jules Schick  [phone] 12/22/2014, 11:09 AM

## 2014-12-22 NOTE — Progress Notes (Signed)
Pt and wife given discharge instructions and accepted well. All questions answered.

## 2014-12-22 NOTE — Progress Notes (Signed)
Physical Therapy Treatment Patient Details Name: Lawrence Wheeler MRN: [id number] DOB: 1946/01/28 Today's Date: 12/22/2014    History of Present Illness 69 yo male s/p R TKA 12/20/14    PT Comments    Progressing very well with mobility. Reviewed exercises, ambulation, and stair negotiation. All education completed.   Follow Up Recommendations  Home health PT     Equipment Recommendations  Rolling walker with 5" wheels    Recommendations for Other Services OT consult     Precautions / Restrictions Precautions Precautions: Knee Required Braces or Orthoses: Knee Immobilizer - Right Knee Immobilizer - Right: Discontinue once straight leg raise with < 10 degree lag (able to SLR-no KI used) Restrictions Weight Bearing Restrictions: No RLE Weight Bearing: Weight bearing as tolerated    Mobility  Bed Mobility Overal bed mobility: Needs Assistance Bed Mobility: Supine to Sit;Sit to Supine     Supine to sit: Min guard Sit to supine: Min guard   General bed mobility comments: close guard for safety  Transfers Overall transfer level: Needs assistance Equipment used: Rolling walker (2 wheeled) Transfers: Sit to/from Stand Sit to Stand: Min guard         General transfer comment: close guard for safety. VCs safety, hand placement  Ambulation/Gait Ambulation/Gait assistance: Min guard Ambulation Distance (Feet): 125 Feet Assistive device: Rolling walker (2 wheeled) Gait Pattern/deviations: Step-through pattern     General Gait Details: close guard for safety.    Stairs Stairs: Yes   Stair Management: One rail Left;Forwards;Step to pattern Number of Stairs: 2 General stair comments: VCS safety, sequence, technique. Wife present to observe. close guard for safety  Wheelchair Mobility    Modified Rankin (Stroke Patients Only)       Balance                                    Cognition Arousal/Alertness: Awake/alert Behavior During Therapy:  WFL for tasks assessed/performed Overall Cognitive Status: Within Functional Limits for tasks assessed                      Exercises Total Joint Exercises Ankle Circles/Pumps: AROM;Both;10 reps;Supine Quad Sets: AROM;Both;10 reps;Supine Hip ABduction/ADduction: AROM;Right;10 reps;Supine Straight Leg Raises: AROM;Right;10 reps;Supine Knee Flexion: AROM;Right;10 reps;Seated Goniometric ROM: 10-75 degrees    General Comments        Pertinent Vitals/Pain Pain Assessment: 0-10 Pain Score: 5  Pain Location: R knee/thigh area Pain Descriptors / Indicators: Sore;Aching Pain Intervention(s): Monitored during session;Ice applied    Home Living                      Prior Function            PT Goals (current goals can now be found in the care plan section) Progress towards PT goals: Progressing toward goals    Frequency  7X/week    PT Plan Current plan remains appropriate    Co-evaluation             End of Session Equipment Utilized During Treatment: Gait belt Activity Tolerance: Patient tolerated treatment well Patient left: in bed;with call bell/phone within reach;with family/visitor present     Time: 4098-1191 PT Time Calculation (min) (ACUTE ONLY): 24 min  Charges:  $Gait Training: 8-22 mins $Therapeutic Exercise: 8-22 mins  G Codes:      Weston Anna, MPT Pager: [phone]

## 2014-12-22 NOTE — Progress Notes (Signed)
Discharge summary sent to payer through [facility]  

## 2014-12-22 NOTE — Progress Notes (Signed)
   Subjective: 2 Days Post-Op Procedure(s) (LRB): RIGHT TOTAL KNEE ARTHROPLASTY (Right) Patient reports pain as mild.   Patient seen in rounds by Dr. Wynelle Link. Patient is well, and has had no acute complaints or problems Patient is ready to go home  Objective: Vital signs in last 24 hours: Temp:  [97.6 F (36.4 C)-99 F (37.2 C)] 98.7 F (37.1 C) (01/27 0555) Pulse Rate:  [66-75] 71 (01/27 0555) Resp:  [15-18] 16 (01/27 0555) BP: (134-145)/(71-85) 135/85 mmHg (01/27 0555) SpO2:  [97 %-98 %] 98 % (01/27 0555)  Intake/Output from previous day:  Intake/Output Summary (Last 24 hours) at 12/22/14 0913 Last data filed at 12/22/14 0600  Gross per 24 hour  Intake 1440.83 ml  Output   1300 ml  Net 140.83 ml   Labs:  Recent Labs  12/21/14 0516 12/22/14 0508  HGB 14.0 12.8*    Recent Labs  12/21/14 0516 12/22/14 0508  WBC 14.0* 16.3*  RBC 4.15* 3.85*  HCT 41.4 38.1*  PLT 217 220    Recent Labs  12/21/14 0516 12/22/14 0508  NA 138 138  K 5.1 4.1  CL 104 103  CO2 27 28  BUN 14 16  CREATININE 1.02 0.99  GLUCOSE 150* 149*  CALCIUM 8.5 8.4   No results for input(s): LABPT, INR in the last 72 hours.  EXAM: General - Patient is Alert, Appropriate and Oriented Extremity - Neurovascular intact Sensation intact distally Dorsiflexion/Plantar flexion intact Incision - clean, dry, no drainage Motor Function - intact, moving foot and toes well on exam.   Assessment/Plan: 2 Days Post-Op Procedure(s) (LRB): RIGHT TOTAL KNEE ARTHROPLASTY (Right) Procedure(s) (LRB): RIGHT TOTAL KNEE ARTHROPLASTY (Right) Past Medical History  Diagnosis Date  . GERD (gastroesophageal reflux disease)   . Arthritis    Principal Problem:   OA (osteoarthritis) of knee  Estimated body mass index is 41.82 kg/(m^2) as calculated from the following:   Height as of this encounter: 5\' 8"  (1.727 m).   Weight as of this encounter: 124.739 kg (275 lb). Up with therapy Discharge home with  home health Diet - Regular diet Follow up - in 2 weeks Activity - WBAT Disposition - Home Condition Upon Discharge - Good D/C Meds - See DC Summary DVT Prophylaxis - [place], PA-C Orthopaedic Surgery 12/22/2014, 9:13 AM

## 2015-06-16 ENCOUNTER — Other Ambulatory Visit: Payer: Self-pay | Admitting: Gastroenterology

## 2015-08-29 ENCOUNTER — Encounter: Payer: Self-pay | Admitting: *Deleted

## 2015-08-30 ENCOUNTER — Ambulatory Visit (INDEPENDENT_AMBULATORY_CARE_PROVIDER_SITE_OTHER): Payer: Commercial Managed Care - HMO | Admitting: Interventional Cardiology

## 2015-08-30 ENCOUNTER — Encounter: Payer: Self-pay | Admitting: Interventional Cardiology

## 2015-08-30 VITALS — BP 146/92 | HR 59 | Ht 67.0 in | Wt 277.8 lb

## 2015-08-30 DIAGNOSIS — R42 Dizziness and giddiness: Secondary | ICD-10-CM | POA: Diagnosis not present

## 2015-08-30 DIAGNOSIS — R0683 Snoring: Secondary | ICD-10-CM

## 2015-08-30 DIAGNOSIS — R0600 Dyspnea, unspecified: Secondary | ICD-10-CM

## 2015-08-30 DIAGNOSIS — I10 Essential (primary) hypertension: Secondary | ICD-10-CM

## 2015-08-30 DIAGNOSIS — R001 Bradycardia, unspecified: Secondary | ICD-10-CM | POA: Diagnosis not present

## 2015-08-30 MED ORDER — AMLODIPINE BESYLATE 5 MG PO TABS: 5.0000 mg | ORAL_TABLET | Freq: Every day | ORAL | Status: AC

## 2015-08-30 NOTE — Progress Notes (Signed)
Cardiology Office Note   Date:  08/30/2015   ID:  Lawrence Wheeler, Lawrence Wheeler 1946-01-09, MRN [id number]  PCP:  Shirline Frees, MD  Cardiologist:  Sinclair Grooms, MD   Chief Complaint  Patient presents with  . Shortness of Breath      History of Present Illness: Lawrence Wheeler is a 69 y.o. male who presents for dizziness, elevated blood pressure, bradycardia, and dyspnea.  This gentleman is referred from Dr. Kenton Kingfisher for cardiovascular evaluation with multiple concerns. One concern is that of elevated blood pressure but no diagnosis of hypertension. He has been monitoring his blood pressure and frequently notes greater than 145/90 mmHg.  There has been gradual increase in dyspnea on exertion over years. This is correlated with increased weight. The exertional dyspnea is not associated with chest discomfort of any variety.  There was concern at Dr. Doreene Adas office about bradycardia. In reviewing records, his heart rate was 60 on check in. We do not have a copy of the EKG that was performed where apparently the heart rate was slower.  The major concern from the patient's point of view his dizziness. This is been present for several weeks. There is a sensation of spinning. It seems to be precipitated by changes in posture and position. Turning his head rapidly will precipitated. There is occasional nausea associated with dizziness. He has bilateral hearing aids.    Past Medical History  Diagnosis Date  . GERD (gastroesophageal reflux disease)   . OA (osteoarthritis)   . Diverticulitis   . Morbid obesity ([place])   . Hearing loss   . Cataract   . Colon polyp     Past Surgical History  Procedure Laterality Date  . Partial colectomy    . Knee arthroscopy      left   . 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 Colonoscopy w/ polypectomy        Current Outpatient Prescriptions  Medication Sig Dispense Refill  . atorvastatin (LIPITOR) 20 MG tablet Take 20 mg by mouth daily.    Marland Kitchen ibuprofen (ADVIL) 200 MG tablet Take 800 mg by mouth 2 (two) times daily as needed (ARTHRITIS PAIN).    Marland Kitchen pantoprazole (PROTONIX) 40 MG tablet Take 40 mg by mouth daily.    Marland Kitchen amLODipine (NORVASC) 5 MG tablet Take 1 tablet (5 mg total) by mouth daily. 30 tablet 11   No current facility-administered medications for this visit.    Allergies:   Flagyl; Penicillins; Doxycycline; Prednisone; Sulfa antibiotics; and Ciprofloxacin    Social History:  The patient  reports that he has never smoked. He has never used smokeless tobacco. He reports that he drinks alcohol. He reports that he does not use illicit drugs.   Family History:  The patient's family history includes Cancer in his mother; Diabetes in his paternal grandfather; Healthy in his child; Heart disease in his brother; Osteoarthritis in his brother, mother, and sister.    ROS:  Please see the history of present illness.   Otherwise, review of systems are positive for osteoarthritis, snoring, dyspnea on exertion, excessive daytime sleepiness..   All other systems are reviewed and negative.    PHYSICAL EXAM: VS:  BP 146/92 mmHg  Pulse 59  Ht 5\' 7"  (1.702 m)  Wt 126.009 kg (277 lb 12.8 oz)  BMI 43.50 kg/m2 , BMI Body mass  index is 43.5 kg/(m^2). GEN: Well nourished, well developed, in no acute distress. Morbid obesity HEENT: normal Neck: no JVD, carotid bruits, or masses Cardiac: RRR.  There is no murmur, rub, or gallop. There is no edema. Respiratory:  clear to auscultation bilaterally, normal work of breathing. GI: soft, nontender, nondistended, + BS MS: no deformity or atrophy Skin: warm and dry, no rash Neuro:  Strength and sensation are intact Psych: euthymic mood, full affect   EKG:  EKG is ordered today. The ekg reveals sinus bradycardia at 59 bpm and otherwise normal   Recent  Labs: 12/15/2014: ALT 55* 12/22/2014: BUN 16; Creatinine, Ser 0.99; Hemoglobin 12.8*; Platelets 220; Potassium 4.1; Sodium 138    Lipid Panel No results found for: CHOL, TRIG, HDL, CHOLHDL, VLDL, LDLCALC, LDLDIRECT    Wt Readings from Last 3 Encounters:  08/30/15 126.009 kg (277 lb 12.8 oz)  12/20/14 124.739 kg (275 lb)  12/15/14 124.739 kg (275 lb)      Other studies Reviewed: Additional studies/ records that were reviewed today include: Records from [facility], Dr. Kenton Kingfisher.. The findings include osteoarthritis of the knee with recent right knee replacement. Other disc every Candiss Norse include bilateral hearing loss, cataracts, and gastroesophageal reflux. He is treated for hyperlipidemia..    ASSESSMENT AND PLAN:  1. Dyspnea Multifactorial most likely. Exclude CAD with exercise treadmill test. There is a possibility of sleep apnea given his snoring history.  2. Bradycardia Asymptomatic and probably not clinically relevant  3. Essential hypertension Untreated elevated blood pressure  4. Vertigo Clinical diagnosis.  5. Snoring Suspect sleep apnea given the patient's body habitus  6. Morbid obesity due to excess calories ([place]) His major cardiovascular comorbidity    Current medicines are reviewed at length with the patient today.  The patient has the following concerns regarding medicines: None other than in-state aggravation of elevated blood pressure.  The following changes/actions have been instituted:    Discontinue non-steroidal anti-inflammatory therapy if possible.  2 g sodium diet  Decreased caloric intake and weight loss  Amlodipine 5 mg daily  Exercise treadmill test to evaluate dyspnea and blood pressure control  Will raise the question of whether a sleep study should be done but leave this to Dr. Kenton Kingfisher.  Labs/ tests ordered today include:   Orders Placed This Encounter  Procedures  . Exercise Tolerance Test  . EKG 12-Lead     Disposition:   FU with  HS in when necessary if no abnormalities found on exercise testing and blood pressure is controlled.   Signed, Sinclair Grooms, MD  08/30/2015 12:57 PM    [address], [place], [place]  [address] Phone: [phone]; Fax: [phone]

## 2015-08-30 NOTE — Patient Instructions (Addendum)
Medication Instructions:  Your physician has recommended you make the following change in your medication:  1) START Amlodipine 5mg  daily. An Rx has been sent to your pharmacy 2) Purchase Antivert(Meclizine) over the counter take 12.5-25mg  twice daily for 1 week.   Labwork: None ordered   Testing/Procedures: Your physician has requested that you have an exercise tolerance test. For further information please visit [email or web address]. Please also follow instruction sheet, as given.(To be scheduled in 3-4 weeks)    Follow-Up: Your physician recommends that you schedule a follow-up appointment pending test results     Any Other Special Instructions Will Be Listed Below (If Applicable). Your physician encouraged you to lose weight for better health.  Your physician discussed the importance of regular exercise and recommended that you start or continue a regular exercise program for good health.  Limit your sodium intake to 2grams daily.  Try to avoid NSAID medications, the can raise your blood pressure

## 2015-09-27 ENCOUNTER — Encounter: Payer: Commercial Managed Care - HMO | Admitting: Nurse Practitioner

## 2015-09-27 ENCOUNTER — Ambulatory Visit (INDEPENDENT_AMBULATORY_CARE_PROVIDER_SITE_OTHER): Payer: Commercial Managed Care - HMO

## 2015-09-27 DIAGNOSIS — R0600 Dyspnea, unspecified: Secondary | ICD-10-CM

## 2015-09-27 DIAGNOSIS — R001 Bradycardia, unspecified: Secondary | ICD-10-CM

## 2015-09-27 LAB — EXERCISE TOLERANCE TEST
CHL CUP MPHR: 129 {beats}/min
CSEPEW: 7 METS
Exercise duration (min): 6 min
Exercise duration (sec): 0 s
Peak HR: 144 {beats}/min
Percent HR: 95 %
RPE: 15
Rest HR: 79 {beats}/min

## 2016-01-11 ENCOUNTER — Encounter (HOSPITAL_COMMUNITY): Payer: Self-pay | Admitting: *Deleted

## 2016-01-18 ENCOUNTER — Other Ambulatory Visit: Payer: Self-pay | Admitting: Gastroenterology

## 2016-01-23 ENCOUNTER — Encounter (HOSPITAL_COMMUNITY): Payer: Self-pay

## 2016-01-23 ENCOUNTER — Ambulatory Visit (HOSPITAL_COMMUNITY): Payer: Commercial Managed Care - HMO | Admitting: Anesthesiology

## 2016-01-23 ENCOUNTER — Ambulatory Visit (HOSPITAL_COMMUNITY)
Admission: RE | Admit: 2016-01-23 | Discharge: 2016-01-23 | Disposition: A | Discharge status: Home / Self Care | Payer: Commercial Managed Care - HMO | Source: Ambulatory Visit | Attending: Gastroenterology | Admitting: Gastroenterology

## 2016-01-23 ENCOUNTER — Encounter (HOSPITAL_COMMUNITY)
Admission: RE | Disposition: A | Discharge status: Home / Self Care | Payer: Self-pay | Source: Ambulatory Visit | Attending: Gastroenterology

## 2016-01-23 DIAGNOSIS — Z96651 Presence of right artificial knee joint: Secondary | ICD-10-CM | POA: Insufficient documentation

## 2016-01-23 DIAGNOSIS — M1711 Unilateral primary osteoarthritis, right knee: Secondary | ICD-10-CM | POA: Insufficient documentation

## 2016-01-23 DIAGNOSIS — H9193 Unspecified hearing loss, bilateral: Secondary | ICD-10-CM | POA: Insufficient documentation

## 2016-01-23 DIAGNOSIS — Z6841 Body Mass Index (BMI) 40.0 and over, adult: Secondary | ICD-10-CM | POA: Diagnosis not present

## 2016-01-23 DIAGNOSIS — Z86010 Personal history of colonic polyps: Secondary | ICD-10-CM | POA: Insufficient documentation

## 2016-01-23 DIAGNOSIS — Z791 Long term (current) use of non-steroidal anti-inflammatories (NSAID): Secondary | ICD-10-CM | POA: Insufficient documentation

## 2016-01-23 DIAGNOSIS — Z9049 Acquired absence of other specified parts of digestive tract: Secondary | ICD-10-CM | POA: Diagnosis not present

## 2016-01-23 DIAGNOSIS — Z09 Encounter for follow-up examination after completed treatment for conditions other than malignant neoplasm: Secondary | ICD-10-CM | POA: Diagnosis present

## 2016-01-23 DIAGNOSIS — K219 Gastro-esophageal reflux disease without esophagitis: Secondary | ICD-10-CM | POA: Diagnosis not present

## 2016-01-23 DIAGNOSIS — I10 Essential (primary) hypertension: Secondary | ICD-10-CM | POA: Diagnosis not present

## 2016-01-23 DIAGNOSIS — Z79899 Other long term (current) drug therapy: Secondary | ICD-10-CM | POA: Diagnosis not present

## 2016-01-23 DIAGNOSIS — K621 Rectal polyp: Secondary | ICD-10-CM | POA: Insufficient documentation

## 2016-01-23 HISTORY — PX: COLONOSCOPY WITH PROPOFOL: SHX5780

## 2016-01-23 HISTORY — PX: HOT HEMOSTASIS: SHX5433

## 2016-01-23 HISTORY — DX: Essential (primary) hypertension: I10

## 2016-01-23 SURGERY — COLONOSCOPY WITH PROPOFOL
Anesthesia: Monitor Anesthesia Care

## 2016-01-23 MED ORDER — PROPOFOL 10 MG/ML IV BOLUS
INTRAVENOUS | Status: AC
  Filled 2016-01-23: qty 40

## 2016-01-23 MED ORDER — SODIUM CHLORIDE 0.9 % IV SOLN: INTRAVENOUS | Status: DC

## 2016-01-23 MED ORDER — LACTATED RINGERS IV SOLN
INTRAVENOUS | Status: DC
  Administered 2016-01-23: 1000 mL via INTRAVENOUS

## 2016-01-23 MED ORDER — PROPOFOL 10 MG/ML IV BOLUS
INTRAVENOUS | Status: DC | PRN
  Administered 2016-01-23: 50 mg via INTRAVENOUS

## 2016-01-23 MED ORDER — PROPOFOL 500 MG/50ML IV EMUL
INTRAVENOUS | Status: DC | PRN
  Administered 2016-01-23: 75 ug/kg/min via INTRAVENOUS

## 2016-01-23 MED ORDER — PROPOFOL 10 MG/ML IV BOLUS
INTRAVENOUS | Status: AC
  Filled 2016-01-23: qty 20

## 2016-01-23 SURGICAL SUPPLY — 21 items

## 2016-01-23 NOTE — Op Note (Signed)
[facility] [place], [address]   COLONOSCOPY PROCEDURE REPORT  PATIENT: Lawrence Wheeler, Lawrence Wheeler  MR#: [id number] BIRTHDATE: [birth year] , 50  yrs. old GENDER: male ENDOSCOPIST: Laurence Spates, MD REFERRED BY:  Dr. Kenton Kingfisher PROCEDURE DATE:  01-28-16 PROCEDURE:   cColonoscopy with Polypectomy ASA CLASS:   class III INDICATIONS:patient had colonoscopy 7/16 with largeadenoma removed from a sending: questionable complete removal. This is done at a six-month interval to assureremoval of this polyp is still present.. The APC is on standbythe fulgaration of residual polyp needed. MEDICATIONS: propofol 400 mg IV  DESCRIPTION OF PROCEDURE:   After the risks and benefits and of the procedure were explained, informed consent was obtained.  digital exam was normal         The Pentax Adult Colonscope [id number] endoscope was introduced through the anus and advanced to the cecum.The appendiceal orifice and ICV were seen and photographed.      .  The quality of the prep was  good      .  The instrument was then slowly withdrawn as the colon was fully examined. Estimated blood loss is zero unless otherwise noted in this procedure report. multiple passes were made in the a sending: and no residual polyp was seen. The scope was withdrawn scattered diverticuli were seenin the sigmoid colon. In the rectum face 4 mm sessile polyp was encountered and was removed with hot snare in recovered. The scope was withdrawn the patient tolerated the procedure well there were no immediate complications.   retro flexion was normal.          The scope was then withdrawn from the patient and the procedure completed.  WITHDRAWAL TIME:  COMPLICATIONS: There were no immediate complications. ENDOSCOPIC IMPRESSION: 1. Rectal Polyp Removed 2. Ascending  polyp was completely  absent.  Apparently removal 7/16 was completely. polyp RECOMMENDATIONS: routine post polypectomy instructions. will  recommend repeating procedure 3 years.  REPEAT EXAM:  cc:Dr. Samara Snide  _______________________________ eSignedLaurence Spates, MD 01-28-2016 12:04 PM   CPT CODES: ICD CODES:  The ICD and CPT codes recommended by this software are interpretations from the data that the clinical staff has captured with the software.  The verification of the translation of this report to the ICD and CPT codes and modifiers is the sole responsibility of the health care institution and practicing physician where this report was generated.  [place]. will not be held responsible for the validity of the ICD and CPT codes included on this report.  AMA assumes no liability for data contained or not contained herein. CPT is a Designer, television/film set of the [employer].   PATIENT NAME:  [name], [name] MR#: [id number]

## 2016-01-23 NOTE — Anesthesia Preprocedure Evaluation (Signed)
Anesthesia Evaluation  Patient identified by MRN, date of birth, ID band Patient awake    Reviewed: Allergy & Precautions, NPO status , Patient's Chart, lab work & pertinent test results  History of Anesthesia Complications Negative for: history of anesthetic complications  Airway Mallampati: III  TM Distance: >3 FB Neck ROM: Full    Dental  (+) Teeth Intact   Pulmonary shortness of breath, neg COPD, neg recent URI,    breath sounds clear to auscultation       Cardiovascular hypertension, Pt. on medications (-) angina(-) Past MI and (-) CHF  Rhythm:Regular     Neuro/Psych negative neurological ROS  negative psych ROS   GI/Hepatic Neg liver ROS, GERD  Medicated and Controlled,  Endo/Other  Morbid obesity  Renal/GU negative Renal ROS     Musculoskeletal  (+) Arthritis ,   Abdominal   Peds  Hematology negative hematology ROS (+)   Anesthesia Other Findings   Reproductive/Obstetrics                             Anesthesia Physical Anesthesia Plan  ASA: III  Anesthesia Plan: MAC   Post-op Pain Management:    Induction: Intravenous  Airway Management Planned: Natural Airway, Nasal Cannula and Simple Face Mask  Additional Equipment: None  Intra-op Plan:   Post-operative Plan:   Informed Consent: I have reviewed the patients History and Physical, chart, labs and discussed the procedure including the risks, benefits and alternatives for the proposed anesthesia with the patient or authorized representative who has indicated his/her understanding and acceptance.   Dental advisory given  Plan Discussed with: CRNA and Surgeon  Anesthesia Plan Comments:         Anesthesia Quick Evaluation

## 2016-01-23 NOTE — Transfer of Care (Signed)
Immediate Anesthesia Transfer of Care Note  Patient: Lawrence Wheeler  Procedure(s) Performed: Procedure(s): COLONOSCOPY WITH PROPOFOL (N/A) HOT HEMOSTASIS (ARGON PLASMA COAGULATION/BICAP) (N/A)  Patient Location: PACU and Endoscopy Unit  Anesthesia Type:MAC  Level of Consciousness: awake, alert , oriented and patient cooperative  Airway & Oxygen Therapy: Patient Spontanous Breathing and Patient connected to face mask oxygen  Post-op Assessment: Report given to RN and Post -op Vital signs reviewed and stable  Post vital signs: Reviewed and stable  Last Vitals:  Filed Vitals:   01/23/16 1033  BP: 152/84  Pulse: 75  Temp: 36.7 C  Resp: 18    Complications: No apparent anesthesia complications

## 2016-01-23 NOTE — Discharge Instructions (Signed)
Colonoscopy, Care After These instructions give you information on caring for yourself after your procedure. Your doctor may also give you more specific instructions. Call your doctor if you have any problems or questions after your procedure. HOME CARE  Do not drive for 24 hours.  Do not sign important papers or use machinery for 24 hours.  You may shower.  You may go back to your usual activities, but go slower for the first 24 hours.  Take rest breaks often during the first 24 hours.  Walk around or use warm packs on your belly (abdomen) if you have belly cramping or gas.  Drink enough fluids to keep your pee (urine) clear or pale yellow.  Resume your normal diet. Avoid heavy or fried foods.  Avoid drinking alcohol for 24 hours or as told by your doctor.  Only take medicines as told by your doctor. If a tissue sample (biopsy) was taken during the procedure:   Do not take aspirin or blood thinners for 7 days, or as told by your doctor.  Do not drink alcohol for 7 days, or as told by your doctor.  Eat soft foods for the first 24 hours. GET HELP IF: You still have a small amount of blood in your poop (stool) 2-3 days after the procedure. GET HELP RIGHT AWAY IF:  You have more than a small amount of blood in your poop.  You see clumps of tissue (blood clots) in your poop.  Your belly is puffy (swollen).  You feel sick to your stomach (nauseous) or throw up (vomit).  You have a fever.  You have belly pain that gets worse and medicine does not help. MAKE SURE YOU:  Understand these instructions.  Will watch your condition.  Will get help right away if you are not doing well or get worse.   This information is not intended to replace advice given to you by your health care provider. Make sure you discuss any questions you have with your health care provider.   Document Released: 12/15/2010 Document Revised: 11/17/2013 Document Reviewed: 07/20/2013 Elsevier  Interactive Patient Education 2016 [place]. No aspirin, ibuprofen or other NSAID medications for 5 days. Office will send note or call when pathology results are obtained. Colonoscopy will be repeated based on the pathology results. Note about pathology to be sent.

## 2016-01-23 NOTE — H&P (Signed)
Subjective:   Patient is a 70 y.o. male presents with history of recent colon polyp. He underwent colonoscopy 7/16 with a large descending colon polyp removed and multiple pieces. This proved to be a benign lesion. We felt we should wait several months and come back and repeat colonoscopy with a PC on standby to fulgerated any additional tissue this is been discussed in detail with the patient in the office. Hewas. Procedure including risks and benefits discussed in office.  Patient Active Problem List   Diagnosis Date Noted  . Dyspnea 08/30/2015  . Bradycardia 08/30/2015  . OA (osteoarthritis) of knee 12/20/2014  . ESCHERICHIA COLI INFECTION IN CCE & UNS SITE 03/23/2010  . ABSCESS, INTRAABDOMINAL 03/23/2010  . DIVERTICULITIS OF COLON 03/17/2010   Past Medical History  Diagnosis Date  . GERD (gastroesophageal reflux disease)   . Diverticulitis   . Morbid obesity ([place])   . Hearing loss     bilateral- hearing aids  . Cataract     mild  . Colon polyp   . Hypertension   . OA (osteoarthritis)     knees    Past Surgical History  Procedure Laterality Date  . Partial colectomy      "diverticulitis"  . Knee arthroscopy      left   . 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 Colonoscopy w/ polypectomy    . Joint replacement Right     RTKA 1'16    Prescriptions prior to admission  Medication Sig Dispense Refill Last Dose  . amLODipine (NORVASC) 5 MG tablet Take 1 tablet (5 mg total) by mouth daily. (Patient taking differently: Take 5 mg by mouth every evening. ) 30 tablet 11 01/22/2016 at Unknown time  . atorvastatin (LIPITOR) 20 MG tablet Take 20 mg by mouth every evening.    01/22/2016 at Unknown time  . ibuprofen (ADVIL) 200 MG tablet Take 800 mg by mouth 2 (two) times daily as needed (ARTHRITIS PAIN).   Past Week at Unknown time  .  pantoprazole (PROTONIX) 40 MG tablet Take 40 mg by mouth every morning.    01/22/2016 at Unknown time   Allergies  Allergen Reactions  . Flagyl [Metronidazole] Nausea Only  . Penicillins Other (See Comments)    REACTION: rash and swelling  Has patient had a PCN reaction causing immediate rash, facial/tongue/throat swelling, SOB or lightheadedness with hypotension: Yes Has patient had a PCN reaction causing severe rash involving mucus membranes or skin necrosis: Yes Has patient had a PCN reaction that required hospitalization No Has patient had a PCN reaction occurring within the last 10 years: Yes If all of the above answers are "NO", then may proceed with Cephalosporin use.   Marland Kitchen Doxycycline Nausea Only  . Prednisone Nausea Only  . Sulfa Antibiotics Nausea Only  . Ciprofloxacin Nausea And Vomiting    Social History  Substance Use Topics  . Smoking status: Never Smoker   . Smokeless tobacco: Never Used  . Alcohol Use: Yes     Comment: occasional     Family History  Problem Relation Age of Onset  . Osteoarthritis Mother   . Cancer Mother   . Osteoarthritis Sister   . Osteoarthritis Brother   . Heart disease Brother   . Healthy Child   . Diabetes Paternal Grandfather      Objective:   Patient Vitals for the  past 8 hrs:  BP Temp Temp src Pulse Resp SpO2 Height Weight  01/23/16 1033 (!) 152/84 mmHg 98 F (36.7 C) Oral 75 18 97 % 5\' 7"  (1.702 m) 125.646 kg (277 lb)         See MD Preop evaluation      Assessment:   1. Colon Polyp. Questionable complete removal 7/16. Procedure is repeated to assure complete removal with APC on standby to perform fulgaration on any residual polyp if needed. Has been discussed in detail with the patient.  Plan:   Colonoscopy today with APC if needed.

## 2016-01-24 ENCOUNTER — Encounter (HOSPITAL_COMMUNITY): Payer: Self-pay | Admitting: Gastroenterology

## 2016-01-26 ENCOUNTER — Encounter (HOSPITAL_COMMUNITY): Payer: Self-pay | Admitting: Gastroenterology

## 2016-01-26 NOTE — Anesthesia Postprocedure Evaluation (Signed)
Anesthesia Post Note  Patient: MCLEAN JABBOUR  Procedure(s) Performed: Procedure(s) (LRB): COLONOSCOPY WITH PROPOFOL (N/A) HOT HEMOSTASIS (ARGON PLASMA COAGULATION/BICAP) (N/A)  Patient location during evaluation: Endoscopy Anesthesia Type: MAC Level of consciousness: awake Pain management: pain level controlled Vital Signs Assessment: post-procedure vital signs reviewed and stable Respiratory status: spontaneous breathing Cardiovascular status: stable Postop Assessment: no signs of nausea or vomiting Anesthetic complications: no    Last Vitals:  Filed Vitals:   01/23/16 1220 01/23/16 1230  BP: 130/81 130/75  Pulse: 55 61  Temp:    Resp: 15 18    Last Pain: There were no vitals filed for this visit.               Christella App

## 2017-02-11 DIAGNOSIS — R748 Abnormal levels of other serum enzymes: Secondary | ICD-10-CM | POA: Diagnosis not present

## 2017-02-11 DIAGNOSIS — R946 Abnormal results of thyroid function studies: Secondary | ICD-10-CM | POA: Diagnosis not present

## 2017-05-10 DIAGNOSIS — I10 Essential (primary) hypertension: Secondary | ICD-10-CM | POA: Diagnosis not present

## 2017-05-10 DIAGNOSIS — R7303 Prediabetes: Secondary | ICD-10-CM | POA: Diagnosis not present

## 2017-05-10 DIAGNOSIS — R946 Abnormal results of thyroid function studies: Secondary | ICD-10-CM | POA: Diagnosis not present

## 2017-05-10 DIAGNOSIS — K219 Gastro-esophageal reflux disease without esophagitis: Secondary | ICD-10-CM | POA: Diagnosis not present

## 2017-05-10 DIAGNOSIS — E7800 Pure hypercholesterolemia, unspecified: Secondary | ICD-10-CM | POA: Diagnosis not present

## 2017-05-10 DIAGNOSIS — M170 Bilateral primary osteoarthritis of knee: Secondary | ICD-10-CM | POA: Diagnosis not present

## 2017-08-05 DIAGNOSIS — H5203 Hypermetropia, bilateral: Secondary | ICD-10-CM | POA: Diagnosis not present

## 2017-08-05 DIAGNOSIS — H43813 Vitreous degeneration, bilateral: Secondary | ICD-10-CM | POA: Diagnosis not present

## 2017-08-05 DIAGNOSIS — H2513 Age-related nuclear cataract, bilateral: Secondary | ICD-10-CM | POA: Diagnosis not present

## 2017-08-05 DIAGNOSIS — H35371 Puckering of macula, right eye: Secondary | ICD-10-CM | POA: Diagnosis not present

## 2017-11-11 DIAGNOSIS — K219 Gastro-esophageal reflux disease without esophagitis: Secondary | ICD-10-CM | POA: Diagnosis not present

## 2017-11-11 DIAGNOSIS — E7800 Pure hypercholesterolemia, unspecified: Secondary | ICD-10-CM | POA: Diagnosis not present

## 2017-11-11 DIAGNOSIS — I10 Essential (primary) hypertension: Secondary | ICD-10-CM | POA: Diagnosis not present

## 2017-11-11 DIAGNOSIS — Z0000 Encounter for general adult medical examination without abnormal findings: Secondary | ICD-10-CM | POA: Diagnosis not present

## 2017-11-11 DIAGNOSIS — Z125 Encounter for screening for malignant neoplasm of prostate: Secondary | ICD-10-CM | POA: Diagnosis not present

## 2017-11-11 DIAGNOSIS — Z23 Encounter for immunization: Secondary | ICD-10-CM | POA: Diagnosis not present

## 2017-11-11 DIAGNOSIS — R7303 Prediabetes: Secondary | ICD-10-CM | POA: Diagnosis not present

## 2017-11-11 DIAGNOSIS — M170 Bilateral primary osteoarthritis of knee: Secondary | ICD-10-CM | POA: Diagnosis not present

## 2017-12-20 DIAGNOSIS — M5417 Radiculopathy, lumbosacral region: Secondary | ICD-10-CM | POA: Diagnosis not present

## 2017-12-20 DIAGNOSIS — M5416 Radiculopathy, lumbar region: Secondary | ICD-10-CM | POA: Diagnosis not present

## 2017-12-31 DIAGNOSIS — M545 Low back pain: Secondary | ICD-10-CM | POA: Diagnosis not present

## 2018-01-14 DIAGNOSIS — M545 Low back pain: Secondary | ICD-10-CM | POA: Diagnosis not present

## 2018-01-28 DIAGNOSIS — M545 Low back pain: Secondary | ICD-10-CM | POA: Diagnosis not present

## 2018-05-12 DIAGNOSIS — K219 Gastro-esophageal reflux disease without esophagitis: Secondary | ICD-10-CM | POA: Diagnosis not present

## 2018-05-12 DIAGNOSIS — M170 Bilateral primary osteoarthritis of knee: Secondary | ICD-10-CM | POA: Diagnosis not present

## 2018-05-12 DIAGNOSIS — R7303 Prediabetes: Secondary | ICD-10-CM | POA: Diagnosis not present

## 2018-05-12 DIAGNOSIS — E7800 Pure hypercholesterolemia, unspecified: Secondary | ICD-10-CM | POA: Diagnosis not present

## 2018-05-12 DIAGNOSIS — L03011 Cellulitis of right finger: Secondary | ICD-10-CM | POA: Diagnosis not present

## 2018-05-12 DIAGNOSIS — I10 Essential (primary) hypertension: Secondary | ICD-10-CM | POA: Diagnosis not present

## 2018-07-01 DIAGNOSIS — G8929 Other chronic pain: Secondary | ICD-10-CM | POA: Diagnosis not present

## 2018-07-01 DIAGNOSIS — M1712 Unilateral primary osteoarthritis, left knee: Secondary | ICD-10-CM | POA: Diagnosis not present

## 2018-08-13 DIAGNOSIS — M1712 Unilateral primary osteoarthritis, left knee: Secondary | ICD-10-CM | POA: Diagnosis not present

## 2018-08-13 DIAGNOSIS — G8929 Other chronic pain: Secondary | ICD-10-CM | POA: Diagnosis not present

## 2018-09-09 DIAGNOSIS — M5416 Radiculopathy, lumbar region: Secondary | ICD-10-CM | POA: Diagnosis not present

## 2018-09-09 DIAGNOSIS — M545 Low back pain: Secondary | ICD-10-CM | POA: Diagnosis not present

## 2018-09-18 DIAGNOSIS — M545 Low back pain: Secondary | ICD-10-CM | POA: Diagnosis not present

## 2018-09-24 DIAGNOSIS — M545 Low back pain: Secondary | ICD-10-CM | POA: Diagnosis not present

## 2018-10-02 DIAGNOSIS — M5136 Other intervertebral disc degeneration, lumbar region: Secondary | ICD-10-CM | POA: Diagnosis not present

## 2018-10-29 DIAGNOSIS — M79604 Pain in right leg: Secondary | ICD-10-CM | POA: Diagnosis not present

## 2018-10-29 DIAGNOSIS — M5431 Sciatica, right side: Secondary | ICD-10-CM | POA: Diagnosis not present

## 2018-12-01 DIAGNOSIS — M179 Osteoarthritis of knee, unspecified: Secondary | ICD-10-CM | POA: Diagnosis not present

## 2018-12-01 DIAGNOSIS — E7800 Pure hypercholesterolemia, unspecified: Secondary | ICD-10-CM | POA: Diagnosis not present

## 2018-12-01 DIAGNOSIS — R7303 Prediabetes: Secondary | ICD-10-CM | POA: Diagnosis not present

## 2018-12-01 DIAGNOSIS — Z23 Encounter for immunization: Secondary | ICD-10-CM | POA: Diagnosis not present

## 2018-12-01 DIAGNOSIS — K219 Gastro-esophageal reflux disease without esophagitis: Secondary | ICD-10-CM | POA: Diagnosis not present

## 2018-12-01 DIAGNOSIS — E161 Other hypoglycemia: Secondary | ICD-10-CM | POA: Diagnosis not present

## 2018-12-01 DIAGNOSIS — Z0000 Encounter for general adult medical examination without abnormal findings: Secondary | ICD-10-CM | POA: Diagnosis not present

## 2018-12-01 DIAGNOSIS — D122 Benign neoplasm of ascending colon: Secondary | ICD-10-CM | POA: Diagnosis not present

## 2018-12-01 DIAGNOSIS — I10 Essential (primary) hypertension: Secondary | ICD-10-CM | POA: Diagnosis not present

## 2019-05-28 DIAGNOSIS — M5136 Other intervertebral disc degeneration, lumbar region: Secondary | ICD-10-CM | POA: Diagnosis not present

## 2019-06-01 DIAGNOSIS — R7303 Prediabetes: Secondary | ICD-10-CM | POA: Diagnosis not present

## 2019-06-01 DIAGNOSIS — K219 Gastro-esophageal reflux disease without esophagitis: Secondary | ICD-10-CM | POA: Diagnosis not present

## 2019-06-01 DIAGNOSIS — I10 Essential (primary) hypertension: Secondary | ICD-10-CM | POA: Diagnosis not present

## 2019-06-01 DIAGNOSIS — M170 Bilateral primary osteoarthritis of knee: Secondary | ICD-10-CM | POA: Diagnosis not present

## 2019-06-01 DIAGNOSIS — E7800 Pure hypercholesterolemia, unspecified: Secondary | ICD-10-CM | POA: Diagnosis not present

## 2019-08-28 DIAGNOSIS — I10 Essential (primary) hypertension: Secondary | ICD-10-CM | POA: Diagnosis not present

## 2019-08-28 DIAGNOSIS — R600 Localized edema: Secondary | ICD-10-CM | POA: Diagnosis not present

## 2019-11-26 DIAGNOSIS — M5136 Other intervertebral disc degeneration, lumbar region: Secondary | ICD-10-CM | POA: Diagnosis not present

## 2020-03-02 DIAGNOSIS — E7800 Pure hypercholesterolemia, unspecified: Secondary | ICD-10-CM | POA: Diagnosis not present

## 2020-03-02 DIAGNOSIS — I10 Essential (primary) hypertension: Secondary | ICD-10-CM | POA: Diagnosis not present

## 2020-03-02 DIAGNOSIS — Z86010 Personal history of colonic polyps: Secondary | ICD-10-CM | POA: Diagnosis not present

## 2020-03-02 DIAGNOSIS — R7303 Prediabetes: Secondary | ICD-10-CM | POA: Diagnosis not present

## 2020-03-02 DIAGNOSIS — Z0000 Encounter for general adult medical examination without abnormal findings: Secondary | ICD-10-CM | POA: Diagnosis not present

## 2020-03-02 DIAGNOSIS — R600 Localized edema: Secondary | ICD-10-CM | POA: Diagnosis not present

## 2020-03-02 DIAGNOSIS — M170 Bilateral primary osteoarthritis of knee: Secondary | ICD-10-CM | POA: Diagnosis not present

## 2020-03-02 DIAGNOSIS — Z125 Encounter for screening for malignant neoplasm of prostate: Secondary | ICD-10-CM | POA: Diagnosis not present

## 2020-03-02 DIAGNOSIS — Z1159 Encounter for screening for other viral diseases: Secondary | ICD-10-CM | POA: Diagnosis not present

## 2020-03-02 DIAGNOSIS — K219 Gastro-esophageal reflux disease without esophagitis: Secondary | ICD-10-CM | POA: Diagnosis not present

## 2020-04-07 DIAGNOSIS — M5136 Other intervertebral disc degeneration, lumbar region: Secondary | ICD-10-CM | POA: Diagnosis not present

## 2020-04-26 DIAGNOSIS — M545 Low back pain: Secondary | ICD-10-CM | POA: Diagnosis not present

## 2020-05-09 DIAGNOSIS — M5136 Other intervertebral disc degeneration, lumbar region: Secondary | ICD-10-CM | POA: Diagnosis not present

## 2020-05-09 DIAGNOSIS — M48062 Spinal stenosis, lumbar region with neurogenic claudication: Secondary | ICD-10-CM | POA: Diagnosis not present

## 2020-05-09 DIAGNOSIS — M5416 Radiculopathy, lumbar region: Secondary | ICD-10-CM | POA: Diagnosis not present

## 2020-05-09 DIAGNOSIS — Z6841 Body Mass Index (BMI) 40.0 and over, adult: Secondary | ICD-10-CM | POA: Diagnosis not present

## 2020-05-13 ENCOUNTER — Ambulatory Visit: Payer: Self-pay | Admitting: Orthopedic Surgery

## 2020-05-18 DIAGNOSIS — M4807 Spinal stenosis, lumbosacral region: Secondary | ICD-10-CM | POA: Diagnosis not present

## 2020-05-18 DIAGNOSIS — M5127 Other intervertebral disc displacement, lumbosacral region: Secondary | ICD-10-CM | POA: Diagnosis not present

## 2020-05-19 DIAGNOSIS — M179 Osteoarthritis of knee, unspecified: Secondary | ICD-10-CM | POA: Diagnosis not present

## 2020-05-19 DIAGNOSIS — E7800 Pure hypercholesterolemia, unspecified: Secondary | ICD-10-CM | POA: Diagnosis not present

## 2020-05-19 DIAGNOSIS — I10 Essential (primary) hypertension: Secondary | ICD-10-CM | POA: Diagnosis not present

## 2020-05-19 DIAGNOSIS — M170 Bilateral primary osteoarthritis of knee: Secondary | ICD-10-CM | POA: Diagnosis not present

## 2020-05-31 DIAGNOSIS — M5116 Intervertebral disc disorders with radiculopathy, lumbar region: Secondary | ICD-10-CM | POA: Diagnosis not present

## 2020-05-31 DIAGNOSIS — M5117 Intervertebral disc disorders with radiculopathy, lumbosacral region: Secondary | ICD-10-CM | POA: Diagnosis not present

## 2020-05-31 DIAGNOSIS — M5127 Other intervertebral disc displacement, lumbosacral region: Secondary | ICD-10-CM | POA: Diagnosis not present

## 2020-06-22 ENCOUNTER — Ambulatory Visit: Admit: 2020-06-22 | Payer: Commercial Managed Care - HMO | Admitting: Orthopedic Surgery

## 2020-06-22 SURGERY — LUMBAR LAMINECTOMY/DECOMPRESSION MICRODISCECTOMY 2 LEVELS
Anesthesia: General

## 2020-08-23 DIAGNOSIS — M545 Low back pain: Secondary | ICD-10-CM | POA: Diagnosis not present

## 2020-08-23 DIAGNOSIS — M4807 Spinal stenosis, lumbosacral region: Secondary | ICD-10-CM | POA: Diagnosis not present

## 2020-08-31 DIAGNOSIS — M4807 Spinal stenosis, lumbosacral region: Secondary | ICD-10-CM | POA: Diagnosis not present

## 2020-09-01 DIAGNOSIS — R7309 Other abnormal glucose: Secondary | ICD-10-CM | POA: Diagnosis not present

## 2020-09-01 DIAGNOSIS — E7800 Pure hypercholesterolemia, unspecified: Secondary | ICD-10-CM | POA: Diagnosis not present

## 2020-09-01 DIAGNOSIS — I10 Essential (primary) hypertension: Secondary | ICD-10-CM | POA: Diagnosis not present

## 2020-09-01 DIAGNOSIS — R7303 Prediabetes: Secondary | ICD-10-CM | POA: Diagnosis not present

## 2020-09-01 DIAGNOSIS — K219 Gastro-esophageal reflux disease without esophagitis: Secondary | ICD-10-CM | POA: Diagnosis not present

## 2020-09-01 DIAGNOSIS — Z23 Encounter for immunization: Secondary | ICD-10-CM | POA: Diagnosis not present

## 2020-09-01 DIAGNOSIS — M170 Bilateral primary osteoarthritis of knee: Secondary | ICD-10-CM | POA: Diagnosis not present

## 2020-09-02 DIAGNOSIS — M4807 Spinal stenosis, lumbosacral region: Secondary | ICD-10-CM | POA: Diagnosis not present

## 2020-09-06 DIAGNOSIS — M4807 Spinal stenosis, lumbosacral region: Secondary | ICD-10-CM | POA: Diagnosis not present

## 2020-09-09 DIAGNOSIS — M4807 Spinal stenosis, lumbosacral region: Secondary | ICD-10-CM | POA: Diagnosis not present

## 2020-09-13 DIAGNOSIS — M4807 Spinal stenosis, lumbosacral region: Secondary | ICD-10-CM | POA: Diagnosis not present

## 2020-09-16 DIAGNOSIS — M4807 Spinal stenosis, lumbosacral region: Secondary | ICD-10-CM | POA: Diagnosis not present

## 2020-09-20 DIAGNOSIS — M4807 Spinal stenosis, lumbosacral region: Secondary | ICD-10-CM | POA: Diagnosis not present

## 2020-09-21 DIAGNOSIS — M170 Bilateral primary osteoarthritis of knee: Secondary | ICD-10-CM | POA: Diagnosis not present

## 2020-09-21 DIAGNOSIS — I10 Essential (primary) hypertension: Secondary | ICD-10-CM | POA: Diagnosis not present

## 2020-09-21 DIAGNOSIS — E7800 Pure hypercholesterolemia, unspecified: Secondary | ICD-10-CM | POA: Diagnosis not present

## 2020-09-21 DIAGNOSIS — M179 Osteoarthritis of knee, unspecified: Secondary | ICD-10-CM | POA: Diagnosis not present

## 2020-09-23 DIAGNOSIS — M4807 Spinal stenosis, lumbosacral region: Secondary | ICD-10-CM | POA: Diagnosis not present

## 2020-09-27 DIAGNOSIS — M4807 Spinal stenosis, lumbosacral region: Secondary | ICD-10-CM | POA: Diagnosis not present

## 2020-09-30 DIAGNOSIS — M4807 Spinal stenosis, lumbosacral region: Secondary | ICD-10-CM | POA: Diagnosis not present

## 2020-10-05 DIAGNOSIS — M4807 Spinal stenosis, lumbosacral region: Secondary | ICD-10-CM | POA: Diagnosis not present

## 2020-10-07 DIAGNOSIS — M4807 Spinal stenosis, lumbosacral region: Secondary | ICD-10-CM | POA: Diagnosis not present

## 2020-10-10 DIAGNOSIS — H524 Presbyopia: Secondary | ICD-10-CM | POA: Diagnosis not present

## 2020-10-10 DIAGNOSIS — H43813 Vitreous degeneration, bilateral: Secondary | ICD-10-CM | POA: Diagnosis not present

## 2020-10-10 DIAGNOSIS — H2513 Age-related nuclear cataract, bilateral: Secondary | ICD-10-CM | POA: Diagnosis not present

## 2020-10-10 DIAGNOSIS — H35371 Puckering of macula, right eye: Secondary | ICD-10-CM | POA: Diagnosis not present

## 2020-10-11 DIAGNOSIS — M4807 Spinal stenosis, lumbosacral region: Secondary | ICD-10-CM | POA: Diagnosis not present

## 2020-10-14 DIAGNOSIS — M4807 Spinal stenosis, lumbosacral region: Secondary | ICD-10-CM | POA: Diagnosis not present

## 2020-10-19 DIAGNOSIS — M4807 Spinal stenosis, lumbosacral region: Secondary | ICD-10-CM | POA: Diagnosis not present

## 2020-10-26 DIAGNOSIS — M4807 Spinal stenosis, lumbosacral region: Secondary | ICD-10-CM | POA: Diagnosis not present

## 2020-10-27 DIAGNOSIS — Z1159 Encounter for screening for other viral diseases: Secondary | ICD-10-CM | POA: Diagnosis not present

## 2020-10-28 DIAGNOSIS — M4807 Spinal stenosis, lumbosacral region: Secondary | ICD-10-CM | POA: Diagnosis not present

## 2020-11-01 DIAGNOSIS — Z86010 Personal history of colonic polyps: Secondary | ICD-10-CM | POA: Diagnosis not present

## 2020-11-01 DIAGNOSIS — D125 Benign neoplasm of sigmoid colon: Secondary | ICD-10-CM | POA: Diagnosis not present

## 2020-11-01 DIAGNOSIS — K5730 Diverticulosis of large intestine without perforation or abscess without bleeding: Secondary | ICD-10-CM | POA: Diagnosis not present

## 2020-11-03 ENCOUNTER — Inpatient Hospital Stay (HOSPITAL_BASED_OUTPATIENT_CLINIC_OR_DEPARTMENT_OTHER)
Admission: EM | Admit: 2020-11-03 | Discharge: 2020-11-06 | DRG: 417 | Disposition: A | Discharge status: Home / Self Care | Payer: PPO | Attending: Internal Medicine | Admitting: Internal Medicine

## 2020-11-03 ENCOUNTER — Encounter (HOSPITAL_BASED_OUTPATIENT_CLINIC_OR_DEPARTMENT_OTHER): Payer: Self-pay

## 2020-11-03 ENCOUNTER — Emergency Department (HOSPITAL_BASED_OUTPATIENT_CLINIC_OR_DEPARTMENT_OTHER): Payer: PPO

## 2020-11-03 ENCOUNTER — Other Ambulatory Visit: Payer: Self-pay

## 2020-11-03 DIAGNOSIS — K219 Gastro-esophageal reflux disease without esophagitis: Secondary | ICD-10-CM | POA: Diagnosis present

## 2020-11-03 DIAGNOSIS — Z9049 Acquired absence of other specified parts of digestive tract: Secondary | ICD-10-CM | POA: Diagnosis not present

## 2020-11-03 DIAGNOSIS — Z8249 Family history of ischemic heart disease and other diseases of the circulatory system: Secondary | ICD-10-CM | POA: Diagnosis not present

## 2020-11-03 DIAGNOSIS — Z6841 Body Mass Index (BMI) 40.0 and over, adult: Secondary | ICD-10-CM

## 2020-11-03 DIAGNOSIS — H9190 Unspecified hearing loss, unspecified ear: Secondary | ICD-10-CM | POA: Diagnosis present

## 2020-11-03 DIAGNOSIS — Z20822 Contact with and (suspected) exposure to covid-19: Secondary | ICD-10-CM | POA: Diagnosis present

## 2020-11-03 DIAGNOSIS — D125 Benign neoplasm of sigmoid colon: Secondary | ICD-10-CM | POA: Diagnosis not present

## 2020-11-03 DIAGNOSIS — R7401 Elevation of levels of liver transaminase levels: Secondary | ICD-10-CM | POA: Diagnosis not present

## 2020-11-03 DIAGNOSIS — K810 Acute cholecystitis: Secondary | ICD-10-CM | POA: Diagnosis present

## 2020-11-03 DIAGNOSIS — R109 Unspecified abdominal pain: Secondary | ICD-10-CM | POA: Insufficient documentation

## 2020-11-03 DIAGNOSIS — Z880 Allergy status to penicillin: Secondary | ICD-10-CM | POA: Diagnosis not present

## 2020-11-03 DIAGNOSIS — K812 Acute cholecystitis with chronic cholecystitis: Secondary | ICD-10-CM | POA: Diagnosis not present

## 2020-11-03 DIAGNOSIS — K449 Diaphragmatic hernia without obstruction or gangrene: Secondary | ICD-10-CM | POA: Diagnosis present

## 2020-11-03 DIAGNOSIS — K831 Obstruction of bile duct: Secondary | ICD-10-CM | POA: Diagnosis present

## 2020-11-03 DIAGNOSIS — R932 Abnormal findings on diagnostic imaging of liver and biliary tract: Secondary | ICD-10-CM | POA: Diagnosis not present

## 2020-11-03 DIAGNOSIS — R188 Other ascites: Secondary | ICD-10-CM | POA: Diagnosis present

## 2020-11-03 DIAGNOSIS — Z888 Allergy status to other drugs, medicaments and biological substances status: Secondary | ICD-10-CM

## 2020-11-03 DIAGNOSIS — Z833 Family history of diabetes mellitus: Secondary | ICD-10-CM

## 2020-11-03 DIAGNOSIS — R1013 Epigastric pain: Secondary | ICD-10-CM | POA: Diagnosis not present

## 2020-11-03 DIAGNOSIS — R1011 Right upper quadrant pain: Secondary | ICD-10-CM | POA: Diagnosis not present

## 2020-11-03 DIAGNOSIS — K4020 Bilateral inguinal hernia, without obstruction or gangrene, not specified as recurrent: Secondary | ICD-10-CM | POA: Diagnosis not present

## 2020-11-03 DIAGNOSIS — R140 Abdominal distension (gaseous): Secondary | ICD-10-CM

## 2020-11-03 DIAGNOSIS — K317 Polyp of stomach and duodenum: Secondary | ICD-10-CM | POA: Diagnosis present

## 2020-11-03 DIAGNOSIS — Z8719 Personal history of other diseases of the digestive system: Secondary | ICD-10-CM | POA: Diagnosis not present

## 2020-11-03 DIAGNOSIS — R945 Abnormal results of liver function studies: Secondary | ICD-10-CM | POA: Diagnosis not present

## 2020-11-03 DIAGNOSIS — E785 Hyperlipidemia, unspecified: Secondary | ICD-10-CM | POA: Diagnosis present

## 2020-11-03 DIAGNOSIS — H269 Unspecified cataract: Secondary | ICD-10-CM | POA: Diagnosis present

## 2020-11-03 DIAGNOSIS — R079 Chest pain, unspecified: Secondary | ICD-10-CM | POA: Diagnosis not present

## 2020-11-03 DIAGNOSIS — Z0389 Encounter for observation for other suspected diseases and conditions ruled out: Secondary | ICD-10-CM | POA: Diagnosis not present

## 2020-11-03 DIAGNOSIS — K760 Fatty (change of) liver, not elsewhere classified: Secondary | ICD-10-CM | POA: Diagnosis present

## 2020-11-03 DIAGNOSIS — R0602 Shortness of breath: Secondary | ICD-10-CM | POA: Diagnosis not present

## 2020-11-03 DIAGNOSIS — I10 Essential (primary) hypertension: Secondary | ICD-10-CM | POA: Diagnosis present

## 2020-11-03 DIAGNOSIS — Z419 Encounter for procedure for purposes other than remedying health state, unspecified: Secondary | ICD-10-CM

## 2020-11-03 DIAGNOSIS — R17 Unspecified jaundice: Secondary | ICD-10-CM

## 2020-11-03 DIAGNOSIS — M1990 Unspecified osteoarthritis, unspecified site: Secondary | ICD-10-CM | POA: Diagnosis present

## 2020-11-03 DIAGNOSIS — K429 Umbilical hernia without obstruction or gangrene: Secondary | ICD-10-CM | POA: Diagnosis present

## 2020-11-03 DIAGNOSIS — Z96651 Presence of right artificial knee joint: Secondary | ICD-10-CM | POA: Diagnosis present

## 2020-11-03 DIAGNOSIS — Z882 Allergy status to sulfonamides status: Secondary | ICD-10-CM | POA: Diagnosis not present

## 2020-11-03 DIAGNOSIS — K8000 Calculus of gallbladder with acute cholecystitis without obstruction: Secondary | ICD-10-CM | POA: Diagnosis not present

## 2020-11-03 DIAGNOSIS — K828 Other specified diseases of gallbladder: Secondary | ICD-10-CM | POA: Diagnosis not present

## 2020-11-03 DIAGNOSIS — I517 Cardiomegaly: Secondary | ICD-10-CM | POA: Diagnosis not present

## 2020-11-03 DIAGNOSIS — I700 Atherosclerosis of aorta: Secondary | ICD-10-CM | POA: Diagnosis not present

## 2020-11-03 LAB — LIPASE, BLOOD: Lipase: 22 U/L (ref 11–51)

## 2020-11-03 LAB — URINALYSIS, MICROSCOPIC (REFLEX)

## 2020-11-03 LAB — PROTIME-INR
INR: 1.2 (ref 0.8–1.2)
Prothrombin Time: 15 seconds (ref 11.4–15.2)

## 2020-11-03 LAB — URINALYSIS, ROUTINE W REFLEX MICROSCOPIC
Glucose, UA: NEGATIVE mg/dL
Ketones, ur: NEGATIVE mg/dL
Nitrite: NEGATIVE
Protein, ur: NEGATIVE mg/dL
Specific Gravity, Urine: 1.01 (ref 1.005–1.030)
pH: 6 (ref 5.0–8.0)

## 2020-11-03 LAB — CBC
HCT: 41.7 % (ref 39.0–52.0)
Hemoglobin: 14.8 g/dL (ref 13.0–17.0)
MCH: 33.9 pg (ref 26.0–34.0)
MCHC: 35.5 g/dL (ref 30.0–36.0)
MCV: 95.4 fL (ref 80.0–100.0)
Platelets: 168 10*3/uL (ref 150–400)
RBC: 4.37 MIL/uL (ref 4.22–5.81)
RDW: 13.2 % (ref 11.5–15.5)
WBC: 11.7 10*3/uL — ABNORMAL HIGH (ref 4.0–10.5)
nRBC: 0 % (ref 0.0–0.2)

## 2020-11-03 LAB — HEPATIC FUNCTION PANEL
ALT: 298 U/L — ABNORMAL HIGH (ref 0–44)
AST: 191 U/L — ABNORMAL HIGH (ref 15–41)
Albumin: 3.7 g/dL (ref 3.5–5.0)
Alkaline Phosphatase: 113 U/L (ref 38–126)
Bilirubin, Direct: 5 mg/dL — ABNORMAL HIGH (ref 0.0–0.2)
Indirect Bilirubin: 4.5 mg/dL — ABNORMAL HIGH (ref 0.3–0.9)
Total Bilirubin: 9.5 mg/dL — ABNORMAL HIGH (ref 0.3–1.2)
Total Protein: 7.5 g/dL (ref 6.5–8.1)

## 2020-11-03 LAB — BASIC METABOLIC PANEL
Anion gap: 9 (ref 5–15)
BUN: 14 mg/dL (ref 8–23)
CO2: 24 mmol/L (ref 22–32)
Calcium: 8.6 mg/dL — ABNORMAL LOW (ref 8.9–10.3)
Chloride: 101 mmol/L (ref 98–111)
Creatinine, Ser: 0.99 mg/dL (ref 0.61–1.24)
GFR, Estimated: >60 mL/min (ref >60)
Glucose, Bld: 143 mg/dL — ABNORMAL HIGH (ref 70–99)
Potassium: 3.7 mmol/L (ref 3.5–5.1)
Sodium: 134 mmol/L — ABNORMAL LOW (ref 135–145)

## 2020-11-03 LAB — RESP PANEL BY RT-PCR (FLU A&B, COVID) ARPGX2
Influenza A by PCR: NEGATIVE
Influenza B by PCR: NEGATIVE
SARS Coronavirus 2 by RT PCR: NEGATIVE

## 2020-11-03 LAB — D-DIMER, QUANTITATIVE: D-Dimer, Quant: 5.03 ug/mL-FEU — ABNORMAL HIGH (ref 0.00–0.50)

## 2020-11-03 LAB — TROPONIN I (HIGH SENSITIVITY)
Troponin I (High Sensitivity): 11 ng/L (ref <18)
Troponin I (High Sensitivity): 11 ng/L (ref <18)

## 2020-11-03 MED ORDER — HYDROMORPHONE HCL 1 MG/ML IJ SOLN
0.5000 mg | Freq: Once | INTRAMUSCULAR | Status: AC
  Administered 2020-11-03: 0.5 mg via INTRAVENOUS
  Filled 2020-11-03: qty 1

## 2020-11-03 MED ORDER — LACTATED RINGERS IV SOLN: INTRAVENOUS | Status: DC

## 2020-11-03 MED ORDER — FENTANYL CITRATE (PF) 100 MCG/2ML IJ SOLN
50.0000 ug | Freq: Once | INTRAMUSCULAR | Status: AC
  Administered 2020-11-03: 50 ug via INTRAVENOUS
  Filled 2020-11-03: qty 2

## 2020-11-03 MED ORDER — HYDRALAZINE HCL 20 MG/ML IJ SOLN
10.0000 mg | INTRAMUSCULAR | Status: DC | PRN
  Administered 2020-11-04: 10 mg via INTRAVENOUS
  Filled 2020-11-03: qty 1

## 2020-11-03 MED ORDER — HYDROMORPHONE HCL 1 MG/ML IJ SOLN
1.0000 mg | INTRAMUSCULAR | Status: DC | PRN
  Administered 2020-11-03 – 2020-11-05 (×7): 1 mg via INTRAVENOUS
  Filled 2020-11-03 (×7): qty 1

## 2020-11-03 MED ORDER — IOHEXOL 350 MG/ML SOLN
100.0000 mL | Freq: Once | INTRAVENOUS | Status: AC | PRN
  Administered 2020-11-03: 100 mL via INTRAVENOUS

## 2020-11-03 MED ORDER — DEXTROSE-NACL 5-0.9 % IV SOLN: INTRAVENOUS | Status: AC

## 2020-11-03 MED ORDER — LACTATED RINGERS IV BOLUS
500.0000 mL | Freq: Once | INTRAVENOUS | Status: AC
  Administered 2020-11-03: 500 mL via INTRAVENOUS

## 2020-11-03 NOTE — ED Notes (Signed)
Pt on monitor and vitals cycling 

## 2020-11-03 NOTE — H&P (Signed)
History and Physical    Lawrence Wheeler UMP:[id number] DOB: 1946-10-14 DOA: 11/03/2020  PCP: Shirline Frees, MD  Patient coming from: Home.  Chief Complaint: Epigastric pain.  HPI: Lawrence Wheeler is a 74 y.o. male with history of hypertension and hyperlipidemia presents to the ER at [facility] with complaints of epigastric pain over the last 48 hours.  Patient had colonoscopy on Tuesday November 01, 2020 at [place] by Dr. Paulita Fujita following which later in the evening patient started developing some epigastric discomfort/chest discomfort with subjective feeling of fever chills.  About midnight patient called EMS and per the patient EKG was unremarkable and patient did not want to go to the ER.  Later patient's pain persisted called GI and patient was instructed to come to the ER.  Patient's pain in the epigastric area increases with food.  Had mild shortness of breath during the episodes of worsening pain with some nausea.  Denies any diarrhea.  ED Course: In the ER patient's labs show elevated LFTs with AST of 191 ALT of 290 eat total bilirubin of 9.5 with direct 5.  High sensitive troponins were negative EKG unremarkable Covid test negative lipase was normal.  CT chest angiogram and CT abdomen pelvis done which shows distended gallbladder with some fluid around it so normal abdomen was unremarkable.  He ER physician discussed with on-call gastroenterologist advised to get a HIDA scan and admitted for further management.  On my exam patient still has some right upper quadrant tenderness.  Pain is improved with pain relief medications.  Review of Systems: As per HPI, rest all negative.   Past Medical History:  Diagnosis Date  . Cataract    mild  . Colon polyp   . Diverticulitis   . GERD (gastroesophageal reflux disease)   . Hearing loss    bilateral- hearing aids  . Hypertension   . Morbid obesity ([place])   . OA (osteoarthritis)    knees    Past Surgical History:  Procedure  Laterality Date  . BACK SURGERY    . COLONOSCOPY W/ POLYPECTOMY    . COLONOSCOPY WITH PROPOFOL N/A 01/23/2016   Procedure: COLONOSCOPY WITH PROPOFOL;  Surgeon: Laurence Spates, MD;  Location: [facility];  Service: Endoscopy;  Laterality: N/A;  . HOT HEMOSTASIS N/A 01/23/2016   Procedure: HOT HEMOSTASIS (ARGON PLASMA COAGULATION/BICAP);  Surgeon: Laurence Spates, MD;  Location: [facility];  Service: Endoscopy;  Laterality: N/A;  . JOINT REPLACEMENT Right    RTKA 1'16  . KNEE ARTHROSCOPY     left   . PARTIAL COLECTOMY     "diverticulitis"  . TOTAL KNEE ARTHROPLASTY Right 12/20/2014   Procedure: RIGHT TOTAL KNEE ARTHROPLASTY;  Surgeon: [name], MD;  Location: [facility];  Service: Orthopedics;  Laterality: Right;     reports that he has never smoked. He has never used smokeless tobacco. He reports current alcohol use. He reports that he does not use drugs.  Allergies  Allergen Reactions  . Flagyl [Metronidazole] Nausea Only  . Penicillins Other (See Comments)    REACTION: rash and swelling  Has patient had a PCN reaction causing immediate rash, facial/tongue/throat swelling, SOB or lightheadedness with hypotension: Yes Has patient had a PCN reaction causing severe rash involving mucus membranes or skin necrosis: Yes Has patient had a PCN reaction that required hospitalization No Has patient had a PCN reaction occurring within the last 10 years: Yes If all of the above answers are "NO", then may proceed with  Cephalosporin use.   Marland Kitchen Doxycycline Nausea Only  . Prednisone Nausea Only  . Sulfa Antibiotics Nausea Only  . Ciprofloxacin Nausea And Vomiting    Family History  Problem Relation Age of Onset  . Osteoarthritis Mother   . Cancer Mother   . Osteoarthritis Sister   . Osteoarthritis Brother   . Heart disease Brother   . Diabetes Paternal Grandfather   . Healthy Child     Prior to Admission medications   Medication Sig Start Date End Date Taking? Authorizing Provider   amLODipine (NORVASC) 5 MG tablet Take 1 tablet (5 mg total) by mouth daily. Patient taking differently: Take 5 mg by mouth every evening. 08/30/15  Yes Belva Crome, MD  atorvastatin (LIPITOR) 20 MG tablet Take 20 mg by mouth every evening.    Yes [provider]  ibuprofen (ADVIL) 200 MG tablet Take 800 mg by mouth 2 (two) times daily as needed (ARTHRITIS PAIN).   Yes [provider]  pantoprazole (PROTONIX) 40 MG tablet Take 40 mg by mouth every morning.    Yes [provider]    Physical Exam: Constitutional: Moderately built and nourished. Vitals:   11/03/20 1600 11/03/20 1700 11/03/20 1800 11/03/20 1947  BP: (!) 127/54 (!) 134/92 127/71 140/84  Pulse: 88 89 86 91  Resp: 20 16 18 18   Temp:  98.9 F (37.2 C)  99 F (37.2 C)  TempSrc:    Oral  SpO2: 95% 95% 96% 91%  Weight:      Height:       Eyes: Anicteric no pallor. ENMT: No discharge from the ears eyes nose or mouth. Neck: No mass felt.  No neck rigidity. Respiratory: No rhonchi or crepitations. Cardiovascular: S1-S2 heard. Abdomen: Right upper quadrant epigastric tenderness no guarding or rigidity. Musculoskeletal: No edema. Skin: No rash. Neurologic: Alert awake oriented to time place and person.  Moves all extremities. Psychiatric: Appears normal.  Normal affect.   Labs on Admission: I have personally reviewed following labs and imaging studies  CBC: Recent Labs  Lab 11/03/20 1050  WBC 11.7*  HGB 14.8  HCT 41.7  MCV 95.4  PLT 638   Basic Metabolic Panel: Recent Labs  Lab 11/03/20 1050  NA 134*  K 3.7  CL 101  CO2 24  GLUCOSE 143*  BUN 14  CREATININE 0.99  CALCIUM 8.6*   GFR: Estimated Creatinine Clearance: 84.6 mL/min (by C-G formula based on SCr of 0.99 mg/dL). Liver Function Tests: Recent Labs  Lab 11/03/20 1050  AST 191*  ALT 298*  ALKPHOS 113  BILITOT 9.5*  PROT 7.5  ALBUMIN 3.7   Recent Labs  Lab 11/03/20 1050  LIPASE 22   No results for input(s):  AMMONIA in the last 168 hours. Coagulation Profile: Recent Labs  Lab 11/03/20 1134  INR 1.2   Cardiac Enzymes: No results for input(s): CKTOTAL, CKMB, CKMBINDEX, TROPONINI in the last 168 hours. BNP (last 3 results) No results for input(s): PROBNP in the last 8760 hours. HbA1C: No results for input(s): HGBA1C in the last 72 hours. CBG: No results for input(s): GLUCAP in the last 168 hours. Lipid Profile: No results for input(s): CHOL, HDL, LDLCALC, TRIG, CHOLHDL, LDLDIRECT in the last 72 hours. Thyroid Function Tests: No results for input(s): TSH, T4TOTAL, FREET4, T3FREE, THYROIDAB in the last 72 hours. Anemia Panel: No results for input(s): VITAMINB12, FOLATE, FERRITIN, TIBC, IRON, RETICCTPCT in the last 72 hours. Urine analysis:    Component Value Date/Time   COLORURINE ORANGE (  A) 11/03/2020 1134   APPEARANCEUR CLEAR 11/03/2020 1134   LABSPEC 1.010 11/03/2020 1134   PHURINE 6.0 11/03/2020 1134   GLUCOSEU NEGATIVE 11/03/2020 1134   GLUCOSEU NEG mg/dL 03/23/2010 1850   HGBUR TRACE (A) 11/03/2020 1134   BILIRUBINUR MODERATE (A) 11/03/2020 1134   KETONESUR NEGATIVE 11/03/2020 1134   PROTEINUR NEGATIVE 11/03/2020 1134   UROBILINOGEN 1.0 12/15/2014 0942   NITRITE NEGATIVE 11/03/2020 1134   LEUKOCYTESUR TRACE (A) 11/03/2020 1134   Sepsis Labs: @LABRCNTIP (procalcitonin:4,lacticidven:4) ) Recent Results (from the past 240 hour(s))  Resp Panel by RT-PCR (Flu A&B, Covid) Nasopharyngeal Swab     Status: None   Collection Time: 11/03/20  4:36 PM   Specimen: Nasopharyngeal Swab; Nasopharyngeal(NP) swabs in vial transport medium  Result Value Ref Range Status   SARS Coronavirus 2 by RT PCR NEGATIVE NEGATIVE Final    Comment: (NOTE) SARS-CoV-2 target nucleic acids are NOT DETECTED.  The SARS-CoV-2 RNA is generally detectable in upper respiratory specimens during the acute phase of infection. The lowest concentration of SARS-CoV-2 viral copies this assay can detect is 138  copies/mL. A negative result does not preclude SARS-Cov-2 infection and should not be used as the sole basis for treatment or other patient management decisions. A negative result may occur with  improper specimen collection/handling, submission of specimen other than nasopharyngeal swab, presence of viral mutation(s) within the areas targeted by this assay, and inadequate number of viral copies(<138 copies/mL). A negative result must be combined with clinical observations, patient history, and epidemiological information. The expected result is Negative.  Fact Sheet for Patients:  EntrepreneurPulse.com.au  Fact Sheet for Healthcare Providers:  IncredibleEmployment.be  This test is no t yet approved or cleared by the [place] FDA and  has been authorized for detection and/or diagnosis of SARS-CoV-2 by FDA under an Emergency Use Authorization (EUA). This EUA will remain  in effect (meaning this test can be used) for the duration of the COVID-19 declaration under Section 564(b)(1) of the Act, 21 U.S.C.section 360bbb-3(b)(1), unless the authorization is terminated  or revoked sooner.       Influenza A by PCR NEGATIVE NEGATIVE Final   Influenza B by PCR NEGATIVE NEGATIVE Final    Comment: (NOTE) The Xpert Xpress SARS-CoV-2/FLU/RSV plus assay is intended as an aid in the diagnosis of influenza from Nasopharyngeal swab specimens and should not be used as a sole basis for treatment. Nasal washings and aspirates are unacceptable for Xpert Xpress SARS-CoV-2/FLU/RSV testing.  Fact Sheet for Patients: EntrepreneurPulse.com.au  Fact Sheet for Healthcare Providers: IncredibleEmployment.be  This test is not yet approved or cleared by the [place] FDA and has been authorized for detection and/or diagnosis of SARS-CoV-2 by FDA under an Emergency Use Authorization (EUA). This EUA will remain in effect (meaning  this test can be used) for the duration of the COVID-19 declaration under Section 564(b)(1) of the Act, 21 U.S.C. section 360bbb-3(b)(1), unless the authorization is terminated or revoked.  Performed at [facility], [address] [place], [place] [address]      Radiological Exams on Admission: DG Chest 2 View  Result Date: 11/03/2020 CLINICAL DATA:  Chest/epigastric pain. EXAM: CHEST - 2 VIEW COMPARISON:  March 04, 2010. FINDINGS: Borderline enlarged cardiac silhouette. Tortuous aorta. Low lung volumes. Both lungs are clear. No visible pleural effusions or pneumothorax. No acute osseous abnormality. IMPRESSION: No active cardiopulmonary disease. Electronically Signed   By: Margaretha Sheffield MD   On: 11/03/2020 11:19   CT Angio Chest PE  W and/or Wo Contrast  Result Date: 11/03/2020 CLINICAL DATA:  Epigastric pain, transaminitis. Shortness of breath. EXAM: CT ANGIOGRAPHY CHEST WITH CONTRAST TECHNIQUE: Multidetector CT imaging of the chest was performed using the standard protocol during bolus administration of intravenous contrast. Multiplanar CT image reconstructions and MIPs were obtained to evaluate the vascular anatomy. CONTRAST:  19mL OMNIPAQUE IOHEXOL 350 MG/ML SOLN COMPARISON:  None. FINDINGS: Cardiovascular: No filling defects in the pulmonary arteries to suggest pulmonary emboli. Heart is normal size. Calcifications throughout the left anterior descending coronary artery. Scattered aortic calcifications. No aneurysm. Mediastinum/Nodes: Small scattered and borderline sized mediastinal lymph nodes, likely reactive, none pathologically enlarged. Trachea and esophagus are unremarkable. Thyroid unremarkable. Lungs/Pleura: Linear dependent densities in the lower lobes compatible with atelectasis. No effusions. No confluent opacities or suspicious pulmonary nodules. Upper Abdomen: Gallbladder moderately distended. Musculoskeletal: Chest wall soft tissues are unremarkable. No acute bony  abnormality. Review of the MIP images confirms the above findings. IMPRESSION: No evidence of pulmonary embolus. Dependent atelectasis in the lower lobes. Coronary artery disease. Gallbladder distension.  See abdominal CT report today. Aortic Atherosclerosis (ICD10-I70.0). Electronically Signed   By: Rolm Baptise M.D.   On: 11/03/2020 14:59   CT ABDOMEN PELVIS W CONTRAST  Result Date: 11/03/2020 CLINICAL DATA:  Epigastric pain, transaminitis.  Recent colonoscopy. EXAM: CT ABDOMEN AND PELVIS WITH CONTRAST TECHNIQUE: Multidetector CT imaging of the abdomen and pelvis was performed using the standard protocol following bolus administration of intravenous contrast. CONTRAST:  117mL OMNIPAQUE IOHEXOL 350 MG/ML SOLN COMPARISON:  04/07/2010 FINDINGS: Lower chest: Dependent atelectasis in the lung bases.  No effusions. Hepatobiliary: Gallbladder is distended. Possible slight surrounding stranding or fluid. No focal hepatic abnormality. No biliary ductal dilatation. Pancreas: No focal abnormality or ductal dilatation. Spleen: No focal abnormality.  Normal size. Adrenals/Urinary Tract: No adrenal abnormality. No focal renal abnormality. No stones or hydronephrosis. Urinary bladder is unremarkable. Stomach/Bowel: Postoperative changes in the sigmoid colon. Normal appendix. Stomach and small bowel decompressed, unremarkable. Vascular/Lymphatic: Aortic calcifications. No evidence of aneurysm or adenopathy. Reproductive: No visible focal abnormality. Other: No free fluid or free air. Small bilateral inguinal hernias and umbilical hernia containing fat. Musculoskeletal: No acute bony abnormality. IMPRESSION: Mild distention of the gallbladder with possible slight surrounding stranding or fluid. Consider further evaluation with right upper quadrant ultrasound. No free air following colonoscopy. Umbilical and bilateral inguinal hernias containing fat. Aortic atherosclerosis. Electronically Signed   By: Rolm Baptise M.D.   On:  11/03/2020 15:02   US Abdomen Limited  Result Date: 11/03/2020 CLINICAL DATA:  ruq pain, trasnaminitis, elevated bili EXAM: ULTRASOUND ABDOMEN LIMITED RIGHT UPPER QUADRANT COMPARISON:  04/07/2010 and prior. FINDINGS: Gallbladder: No gallstones or wall thickening visualized. No sonographic Kilian sign noted by sonographer. Common bile duct: Diameter: 5.3 mm Liver: No focal lesion identified. Increased parenchymal echogenicity. Portal vein is patent on color Doppler imaging with normal direction of blood flow towards the liver. Other: None. IMPRESSION: Hepatic steatosis. Otherwise unremarkable right upper quadrant ultrasound. Electronically Signed   By: Primitivo Gauze M.D.   On: 11/03/2020 12:57    EKG: Independently reviewed.  Normal sinus rhythm.  Assessment/Plan Principal Problem:   RUQ abdominal pain Active Problems:   Transaminitis   Essential hypertension   Abdominal pain    1. Epigastric/right upper pain with subjective feeling of fever chills elevated LFTs concerning for possible acute cholecystitis.  Will get HIDA scan keep patient n.p.o. empiric antibiotics cultures IV fluids pain relief medication.  Further recommendations based on HIDA scan. 2. History  of hypertension we'll keep patient on as needed IV hydralazine since patient is n.p.o. 3. History of hyperlipidemia presently n.p.o.   DVT prophylaxis: SCDs in anticipation of procedures will avoid anticoagulation. Code Status: Full code. Family Communication: Discussed with patient. Disposition Plan: Home. Consults called: ER physician discussed with [facility]. Admission status: Observation.   Rise Patience MD [facility] Pager [phone].  If 7PM-7AM, please contact night-coverage [email or web address] Password [facility]  11/03/2020, 9:37 PM

## 2020-11-03 NOTE — ED Provider Notes (Signed)
[facility] call back to review results and anticipated admit. Physical Exam  BP 110/61   Pulse 80   Temp 98.5 F (36.9 C)   Resp 18   Ht 5\' 8"  (1.727 m)   Wt 122.5 kg   SpO2 95%   BMI 41.05 kg/m   Physical Exam  ED Course/Procedures     Procedures  MDM  Patient rechecked. Alert and appropriate without respiratory distress. He does continue to have right upper quadrant abdominal pain that rebounds and requires ongoing pain control.  Consultation with hospitalist for admission.       Charlesetta Shanks, MD 11/04/20 1426

## 2020-11-03 NOTE — ED Provider Notes (Signed)
[facility] EMERGENCY DEPARTMENT Provider Note   CSN: [id number] Arrival date & time: 11/03/20  1029     History Chief Complaint  Patient presents with  . Chest Pain  . Shortness of Breath    Lawrence Wheeler is a 74 y.o. male.  Presents to ER with concern for chest pain, shortness of breath, abdominal pain.  Patient reports having colonoscopy on Tuesday, no known complications with procedure at that time.  That evening he felt short of breath, intermittent chest pain and epigastric pain.  Throughout the day yesterday and today, continues to have pain, discomfort, moderate, nonradiating.  Pain is worse in his upper abdomen and right upper abdomen.  Not feeling short of breath at present.  No nausea or vomiting.  Reports he has been told his bilirubin is mildly elevated but denies any gallbladder or liver problems previously.    HPI     Past Medical History:  Diagnosis Date  . Cataract    mild  . Colon polyp   . Diverticulitis   . GERD (gastroesophageal reflux disease)   . Hearing loss    bilateral- hearing aids  . Hypertension   . Morbid obesity ([place])   . OA (osteoarthritis)    knees    Patient Active Problem List   Diagnosis Date Noted  . Dyspnea 08/30/2015  . Bradycardia 08/30/2015  . OA (osteoarthritis) of knee 12/20/2014  . ESCHERICHIA COLI INFECTION IN CCE & UNS SITE 03/23/2010  . ABSCESS, INTRAABDOMINAL 03/23/2010  . DIVERTICULITIS OF COLON 03/17/2010    Past Surgical History:  Procedure Laterality Date  . BACK SURGERY    . COLONOSCOPY W/ POLYPECTOMY    . COLONOSCOPY WITH PROPOFOL N/A 01/23/2016   Procedure: COLONOSCOPY WITH PROPOFOL;  Surgeon: Laurence Spates, MD;  Location: [facility];  Service: Endoscopy;  Laterality: N/A;  . HOT HEMOSTASIS N/A 01/23/2016   Procedure: HOT HEMOSTASIS (ARGON PLASMA COAGULATION/BICAP);  Surgeon: Laurence Spates, MD;  Location: [facility];  Service: Endoscopy;  Laterality: N/A;  . JOINT REPLACEMENT Right    RTKA  1'16  . KNEE ARTHROSCOPY     left   . PARTIAL COLECTOMY     "diverticulitis"  . TOTAL KNEE ARTHROPLASTY Right 12/20/2014   Procedure: RIGHT TOTAL KNEE ARTHROPLASTY;  Surgeon: [name], MD;  Location: [facility];  Service: Orthopedics;  Laterality: Right;       Family History  Problem Relation Age of Onset  . Osteoarthritis Mother   . Cancer Mother   . Osteoarthritis Sister   . Osteoarthritis Brother   . Heart disease Brother   . Diabetes Paternal Grandfather   . Healthy Child     Social History   Tobacco Use  . Smoking status: Never Smoker  . Smokeless tobacco: Never Used  Substance Use Topics  . Alcohol use: Yes    Comment: occasional   . Drug use: No    Home Medications Prior to Admission medications   Medication Sig Start Date End Date Taking? Authorizing Provider  amLODipine (NORVASC) 5 MG tablet Take 1 tablet (5 mg total) by mouth daily. Patient taking differently: Take 5 mg by mouth every evening.  08/30/15   Belva Crome, MD  atorvastatin (LIPITOR) 20 MG tablet Take 20 mg by mouth every evening.     [provider]  ibuprofen (ADVIL) 200 MG tablet Take 800 mg by mouth 2 (two) times daily as needed (ARTHRITIS PAIN).    [provider]  pantoprazole (PROTONIX) 40 MG tablet  Take 40 mg by mouth every morning.     [provider]    Allergies    Flagyl [metronidazole], Penicillins, Doxycycline, Prednisone, Sulfa antibiotics, and Ciprofloxacin  Review of Systems   Review of Systems  Constitutional: Negative for chills and fever.  HENT: Negative for ear pain and sore throat.   Eyes: Negative for pain and visual disturbance.  Respiratory: Positive for shortness of breath. Negative for cough.   Cardiovascular: Positive for chest pain. Negative for palpitations.  Gastrointestinal: Positive for abdominal pain. Negative for vomiting.  Genitourinary: Negative for dysuria and hematuria.  Musculoskeletal: Negative for arthralgias and back  pain.  Skin: Negative for color change and rash.  Neurological: Negative for seizures and syncope.  All other systems reviewed and are negative.   Physical Exam Updated Vital Signs BP (!) 127/54   Pulse 88   Temp 98.5 F (36.9 C)   Resp 20   Ht 5\' 8"  (1.727 m)   Wt 122.5 kg   SpO2 95%   BMI 41.05 kg/m   Physical Exam Vitals and nursing note reviewed.  Constitutional:      Appearance: He is well-developed and well-nourished.  HENT:     Head: Normocephalic and atraumatic.  Eyes:     Conjunctiva/sclera: Conjunctivae normal.  Cardiovascular:     Rate and Rhythm: Normal rate and regular rhythm.     Heart sounds: No murmur heard.   Pulmonary:     Effort: Pulmonary effort is normal. No respiratory distress.     Breath sounds: Normal breath sounds.  Abdominal:     Palpations: Abdomen is soft.     Tenderness: There is no abdominal tenderness.  Musculoskeletal:        General: No edema.     Cervical back: Neck supple.     Right lower leg: No edema.     Left lower leg: No edema.  Skin:    General: Skin is warm and dry.     Capillary Refill: Capillary refill takes less than 2 seconds.  Neurological:     General: No focal deficit present.     Mental Status: He is alert.  Psychiatric:        Mood and Affect: Mood and affect normal.     ED Results / Procedures / Treatments   Labs (all labs ordered are listed, but only abnormal results are displayed) Labs Reviewed  BASIC METABOLIC PANEL - Abnormal; Notable for the following components:      Result Value   Sodium 134 (*)    Glucose, Bld 143 (*)    Calcium 8.6 (*)    All other components within normal limits  CBC - Abnormal; Notable for the following components:   WBC 11.7 (*)    All other components within normal limits  HEPATIC FUNCTION PANEL - Abnormal; Notable for the following components:   AST 191 (*)    ALT 298 (*)    Total Bilirubin 9.5 (*)    Bilirubin, Direct 5.0 (*)    Indirect Bilirubin 4.5 (*)     All other components within normal limits  D-DIMER, QUANTITATIVE (NOT AT [facility]) - Abnormal; Notable for the following components:   D-Dimer, Quant 5.03 (*)    All other components within normal limits  URINALYSIS, ROUTINE W REFLEX MICROSCOPIC - Abnormal; Notable for the following components:   Color, Urine ORANGE (*)    Hgb urine dipstick TRACE (*)    Bilirubin Urine MODERATE (*)    Leukocytes,Ua TRACE (*)  All other components within normal limits  URINALYSIS, MICROSCOPIC (REFLEX) - Abnormal; Notable for the following components:   Bacteria, UA FEW (*)    All other components within normal limits  LIPASE, BLOOD  PROTIME-INR  TROPONIN I (HIGH SENSITIVITY)  TROPONIN I (HIGH SENSITIVITY)    EKG EKG Interpretation  Date/Time:  Thursday November 03 2020 10:41:55 EST Ventricular Rate:  86 PR Interval:    QRS Duration: 88 QT Interval:  337 QTC Calculation: 403 R Axis:   26 Text Interpretation: Sinus rhythm Atrial premature complex Low voltage, precordial leads Borderline T wave abnormalities Confirmed by Madalyn Rob [phone]) on 11/03/2020 10:48:00 AM   Radiology DG Chest 2 View  Result Date: 11/03/2020 CLINICAL DATA:  Chest/epigastric pain. EXAM: CHEST - 2 VIEW COMPARISON:  March 04, 2010. FINDINGS: Borderline enlarged cardiac silhouette. Tortuous aorta. Low lung volumes. Both lungs are clear. No visible pleural effusions or pneumothorax. No acute osseous abnormality. IMPRESSION: No active cardiopulmonary disease. Electronically Signed   By: Margaretha Sheffield MD   On: 11/03/2020 11:19   CT Angio Chest PE W and/or Wo Contrast  Result Date: 11/03/2020 CLINICAL DATA:  Epigastric pain, transaminitis. Shortness of breath. EXAM: CT ANGIOGRAPHY CHEST WITH CONTRAST TECHNIQUE: Multidetector CT imaging of the chest was performed using the standard protocol during bolus administration of intravenous contrast. Multiplanar CT image reconstructions and MIPs were obtained to evaluate the  vascular anatomy. CONTRAST:  118mL OMNIPAQUE IOHEXOL 350 MG/ML SOLN COMPARISON:  None. FINDINGS: Cardiovascular: No filling defects in the pulmonary arteries to suggest pulmonary emboli. Heart is normal size. Calcifications throughout the left anterior descending coronary artery. Scattered aortic calcifications. No aneurysm. Mediastinum/Nodes: Small scattered and borderline sized mediastinal lymph nodes, likely reactive, none pathologically enlarged. Trachea and esophagus are unremarkable. Thyroid unremarkable. Lungs/Pleura: Linear dependent densities in the lower lobes compatible with atelectasis. No effusions. No confluent opacities or suspicious pulmonary nodules. Upper Abdomen: Gallbladder moderately distended. Musculoskeletal: Chest wall soft tissues are unremarkable. No acute bony abnormality. Review of the MIP images confirms the above findings. IMPRESSION: No evidence of pulmonary embolus. Dependent atelectasis in the lower lobes. Coronary artery disease. Gallbladder distension.  See abdominal CT report today. Aortic Atherosclerosis (ICD10-I70.0). Electronically Signed   By: Rolm Baptise M.D.   On: 11/03/2020 14:59   CT ABDOMEN PELVIS W CONTRAST  Result Date: 11/03/2020 CLINICAL DATA:  Epigastric pain, transaminitis.  Recent colonoscopy. EXAM: CT ABDOMEN AND PELVIS WITH CONTRAST TECHNIQUE: Multidetector CT imaging of the abdomen and pelvis was performed using the standard protocol following bolus administration of intravenous contrast. CONTRAST:  164mL OMNIPAQUE IOHEXOL 350 MG/ML SOLN COMPARISON:  04/07/2010 FINDINGS: Lower chest: Dependent atelectasis in the lung bases.  No effusions. Hepatobiliary: Gallbladder is distended. Possible slight surrounding stranding or fluid. No focal hepatic abnormality. No biliary ductal dilatation. Pancreas: No focal abnormality or ductal dilatation. Spleen: No focal abnormality.  Normal size. Adrenals/Urinary Tract: No adrenal abnormality. No focal renal abnormality. No  stones or hydronephrosis. Urinary bladder is unremarkable. Stomach/Bowel: Postoperative changes in the sigmoid colon. Normal appendix. Stomach and small bowel decompressed, unremarkable. Vascular/Lymphatic: Aortic calcifications. No evidence of aneurysm or adenopathy. Reproductive: No visible focal abnormality. Other: No free fluid or free air. Small bilateral inguinal hernias and umbilical hernia containing fat. Musculoskeletal: No acute bony abnormality. IMPRESSION: Mild distention of the gallbladder with possible slight surrounding stranding or fluid. Consider further evaluation with right upper quadrant ultrasound. No free air following colonoscopy. Umbilical and bilateral inguinal hernias containing fat. Aortic atherosclerosis. Electronically Signed   By: Lennette Bihari  [place] M.D.   On: 11/03/2020 15:02   US Abdomen Limited  Result Date: 11/03/2020 CLINICAL DATA:  ruq pain, trasnaminitis, elevated bili EXAM: ULTRASOUND ABDOMEN LIMITED RIGHT UPPER QUADRANT COMPARISON:  04/07/2010 and prior. FINDINGS: Gallbladder: No gallstones or wall thickening visualized. No sonographic Brendel sign noted by sonographer. Common bile duct: Diameter: 5.3 mm Liver: No focal lesion identified. Increased parenchymal echogenicity. Portal vein is patent on color Doppler imaging with normal direction of blood flow towards the liver. Other: None. IMPRESSION: Hepatic steatosis. Otherwise unremarkable right upper quadrant ultrasound. Electronically Signed   By: Primitivo Gauze M.D.   On: 11/03/2020 12:57    Procedures Procedures (including critical care time)  Medications Ordered in ED Medications  fentaNYL (SUBLIMAZE) injection 50 mcg (50 mcg Intravenous Given 11/03/20 1131)  HYDROmorphone (DILAUDID) injection 0.5 mg (0.5 mg Intravenous Given 11/03/20 1335)  iohexol (OMNIPAQUE) 350 MG/ML injection 100 mL (100 mLs Intravenous Contrast Given 11/03/20 1424)  fentaNYL (SUBLIMAZE) injection 50 mcg (50 mcg Intravenous Given 11/03/20  1536)    ED Course  I have reviewed the triage vital signs and the nursing notes.  Pertinent labs & imaging results that were available during my care of the patient were reviewed by me and considered in my medical decision making (see chart for details).    MDM Rules/Calculators/A&P                         74 year old male presenting to the emergency room with concern for chest pain, epigastric pain.  Recent colonoscopy.  On exam patient well-appearing in no distress, mildly uncomfortable due to pain.  Noted tenderness in epigastrium and right upper quadrant.  LFTs notable for profoundly elevated T bili.  Right upper quadrant ultrasound was read as negative.  Given elevated dimer, CTA chest was obtained which was negative for pulmonary embolism.  Given his ongoing abdominal pain, CT abdomen pelvis was obtained to further evaluate.  CT commented on mild distention of gallbladder with possible slight surrounding stranding and recommended right upper quadrant ultrasound.  Reviewed findings in detail with Dr. Therisa Doyne.  She states if patient's pain and nausea is readily controlled and patient is tolerating p.o. without difficulty, then would consider discharge with close out patient monitoring of his abnormal T bili. If pain/nausea not controlled, then admit to hospitalist and they would be available to see in consultation, consider HIDA.   Paged hospitalist for admit. Signed out to [place] while awaiting call back.    Final Clinical Impression(s) / ED Diagnoses Final diagnoses:  RUQ pain  Transaminitis  Total bilirubin, elevated  Epigastric pain  Chest pain, unspecified type    Rx / DC Orders ED Discharge Orders    None       Lucrezia Starch, MD 11/03/20 1609

## 2020-11-03 NOTE — ED Notes (Signed)
Pt ambulatory to restroom without difficulty.

## 2020-11-03 NOTE — ED Triage Notes (Signed)
Pt reports epigastric pain with shortness of breath since Tuesday after a colonoscopy on tuesday. Pt reports the pain as a soreness. Pt states he called EMS on Tuesday night but didn't come to the hospital.

## 2020-11-03 NOTE — ED Notes (Signed)
Called lab to add on PT-INR to previously drawn labwork.

## 2020-11-03 NOTE — ED Notes (Signed)
Called lab to add on hepatic function panel and lipase.

## 2020-11-04 ENCOUNTER — Inpatient Hospital Stay (HOSPITAL_COMMUNITY): Payer: PPO | Admitting: Anesthesiology

## 2020-11-04 ENCOUNTER — Inpatient Hospital Stay (HOSPITAL_COMMUNITY): Payer: PPO

## 2020-11-04 ENCOUNTER — Encounter (HOSPITAL_COMMUNITY)
Admission: EM | Disposition: A | Discharge status: Home / Self Care | Payer: Self-pay | Source: Home / Self Care | Attending: Internal Medicine

## 2020-11-04 ENCOUNTER — Encounter (HOSPITAL_COMMUNITY): Payer: Self-pay | Admitting: Internal Medicine

## 2020-11-04 DIAGNOSIS — R188 Other ascites: Secondary | ICD-10-CM | POA: Diagnosis present

## 2020-11-04 DIAGNOSIS — K317 Polyp of stomach and duodenum: Secondary | ICD-10-CM | POA: Diagnosis present

## 2020-11-04 DIAGNOSIS — R140 Abdominal distension (gaseous): Secondary | ICD-10-CM | POA: Diagnosis not present

## 2020-11-04 DIAGNOSIS — Z882 Allergy status to sulfonamides status: Secondary | ICD-10-CM | POA: Diagnosis not present

## 2020-11-04 DIAGNOSIS — Z880 Allergy status to penicillin: Secondary | ICD-10-CM | POA: Diagnosis not present

## 2020-11-04 DIAGNOSIS — Z833 Family history of diabetes mellitus: Secondary | ICD-10-CM | POA: Diagnosis not present

## 2020-11-04 DIAGNOSIS — Z20822 Contact with and (suspected) exposure to covid-19: Secondary | ICD-10-CM | POA: Diagnosis present

## 2020-11-04 DIAGNOSIS — K429 Umbilical hernia without obstruction or gangrene: Secondary | ICD-10-CM | POA: Diagnosis present

## 2020-11-04 DIAGNOSIS — K219 Gastro-esophageal reflux disease without esophagitis: Secondary | ICD-10-CM | POA: Diagnosis present

## 2020-11-04 DIAGNOSIS — Z8719 Personal history of other diseases of the digestive system: Secondary | ICD-10-CM | POA: Diagnosis not present

## 2020-11-04 DIAGNOSIS — Z9049 Acquired absence of other specified parts of digestive tract: Secondary | ICD-10-CM | POA: Diagnosis not present

## 2020-11-04 DIAGNOSIS — H9190 Unspecified hearing loss, unspecified ear: Secondary | ICD-10-CM | POA: Diagnosis present

## 2020-11-04 DIAGNOSIS — K831 Obstruction of bile duct: Secondary | ICD-10-CM

## 2020-11-04 DIAGNOSIS — K810 Acute cholecystitis: Secondary | ICD-10-CM | POA: Diagnosis present

## 2020-11-04 DIAGNOSIS — Z888 Allergy status to other drugs, medicaments and biological substances status: Secondary | ICD-10-CM | POA: Diagnosis not present

## 2020-11-04 DIAGNOSIS — Z6841 Body Mass Index (BMI) 40.0 and over, adult: Secondary | ICD-10-CM | POA: Diagnosis not present

## 2020-11-04 DIAGNOSIS — Z8249 Family history of ischemic heart disease and other diseases of the circulatory system: Secondary | ICD-10-CM | POA: Diagnosis not present

## 2020-11-04 DIAGNOSIS — H269 Unspecified cataract: Secondary | ICD-10-CM | POA: Diagnosis present

## 2020-11-04 DIAGNOSIS — M1990 Unspecified osteoarthritis, unspecified site: Secondary | ICD-10-CM | POA: Diagnosis present

## 2020-11-04 DIAGNOSIS — K760 Fatty (change of) liver, not elsewhere classified: Secondary | ICD-10-CM | POA: Diagnosis present

## 2020-11-04 DIAGNOSIS — Z96651 Presence of right artificial knee joint: Secondary | ICD-10-CM | POA: Diagnosis present

## 2020-11-04 DIAGNOSIS — K449 Diaphragmatic hernia without obstruction or gangrene: Secondary | ICD-10-CM | POA: Diagnosis present

## 2020-11-04 DIAGNOSIS — E785 Hyperlipidemia, unspecified: Secondary | ICD-10-CM | POA: Diagnosis present

## 2020-11-04 DIAGNOSIS — I10 Essential (primary) hypertension: Secondary | ICD-10-CM | POA: Diagnosis present

## 2020-11-04 DIAGNOSIS — D125 Benign neoplasm of sigmoid colon: Secondary | ICD-10-CM | POA: Diagnosis not present

## 2020-11-04 HISTORY — PX: CHOLECYSTECTOMY: SHX55

## 2020-11-04 LAB — HEPATIC FUNCTION PANEL
ALT: 227 U/L — ABNORMAL HIGH (ref 0–44)
AST: 142 U/L — ABNORMAL HIGH (ref 15–41)
Albumin: 3.4 g/dL — ABNORMAL LOW (ref 3.5–5.0)
Alkaline Phosphatase: 124 U/L (ref 38–126)
Bilirubin, Direct: 6.3 mg/dL — ABNORMAL HIGH (ref 0.0–0.2)
Indirect Bilirubin: 3.6 mg/dL — ABNORMAL HIGH (ref 0.3–0.9)
Total Bilirubin: 9.9 mg/dL — ABNORMAL HIGH (ref 0.3–1.2)
Total Protein: 7.1 g/dL (ref 6.5–8.1)

## 2020-11-04 LAB — BASIC METABOLIC PANEL
Anion gap: 9 (ref 5–15)
BUN: 13 mg/dL (ref 8–23)
CO2: 24 mmol/L (ref 22–32)
Calcium: 8.3 mg/dL — ABNORMAL LOW (ref 8.9–10.3)
Chloride: 104 mmol/L (ref 98–111)
Creatinine, Ser: 0.85 mg/dL (ref 0.61–1.24)
GFR, Estimated: >60 mL/min (ref >60)
Glucose, Bld: 138 mg/dL — ABNORMAL HIGH (ref 70–99)
Potassium: 3.5 mmol/L (ref 3.5–5.1)
Sodium: 137 mmol/L (ref 135–145)

## 2020-11-04 LAB — CBC
HCT: 41.9 % (ref 39.0–52.0)
Hemoglobin: 14 g/dL (ref 13.0–17.0)
MCH: 33.9 pg (ref 26.0–34.0)
MCHC: 33.4 g/dL (ref 30.0–36.0)
MCV: 101.5 fL — ABNORMAL HIGH (ref 80.0–100.0)
Platelets: 152 10*3/uL (ref 150–400)
RBC: 4.13 MIL/uL — ABNORMAL LOW (ref 4.22–5.81)
RDW: 13.6 % (ref 11.5–15.5)
WBC: 9.4 10*3/uL (ref 4.0–10.5)
nRBC: 0 % (ref 0.0–0.2)

## 2020-11-04 LAB — GLUCOSE, CAPILLARY
Glucose-Capillary: 112 mg/dL — ABNORMAL HIGH (ref 70–99)
Glucose-Capillary: 116 mg/dL — ABNORMAL HIGH (ref 70–99)
Glucose-Capillary: 169 mg/dL — ABNORMAL HIGH (ref 70–99)
Glucose-Capillary: 209 mg/dL — ABNORMAL HIGH (ref 70–99)

## 2020-11-04 LAB — TROPONIN I (HIGH SENSITIVITY): Troponin I (High Sensitivity): 9 ng/L (ref <18)

## 2020-11-04 SURGERY — LAPAROSCOPIC CHOLECYSTECTOMY WITH INTRAOPERATIVE CHOLANGIOGRAM
Anesthesia: General | Site: Abdomen

## 2020-11-04 MED ORDER — ONDANSETRON HCL 4 MG/2ML IJ SOLN
INTRAMUSCULAR | Status: AC
  Filled 2020-11-04: qty 2

## 2020-11-04 MED ORDER — PHENYLEPHRINE 40 MCG/ML (10ML) SYRINGE FOR IV PUSH (FOR BLOOD PRESSURE SUPPORT)
PREFILLED_SYRINGE | INTRAVENOUS | Status: DC | PRN
  Administered 2020-11-04 (×2): 160 ug via INTRAVENOUS
  Administered 2020-11-04: 40 ug via INTRAVENOUS

## 2020-11-04 MED ORDER — PROPOFOL 10 MG/ML IV BOLUS
INTRAVENOUS | Status: AC
  Filled 2020-11-04: qty 20

## 2020-11-04 MED ORDER — FENTANYL CITRATE (PF) 100 MCG/2ML IJ SOLN
INTRAMUSCULAR | Status: DC | PRN
  Administered 2020-11-04: 50 ug via INTRAVENOUS

## 2020-11-04 MED ORDER — LACTATED RINGERS IR SOLN
Status: DC | PRN
  Administered 2020-11-04: 1000 mL

## 2020-11-04 MED ORDER — GABAPENTIN 300 MG PO CAPS
ORAL_CAPSULE | ORAL | Status: AC
  Filled 2020-11-04: qty 1

## 2020-11-04 MED ORDER — ROCURONIUM BROMIDE 100 MG/10ML IV SOLN
INTRAVENOUS | Status: DC | PRN
  Administered 2020-11-04: 100 mg via INTRAVENOUS

## 2020-11-04 MED ORDER — CHLORHEXIDINE GLUCONATE 0.12 % MT SOLN
15.0000 mL | OROMUCOSAL | Status: AC
  Administered 2020-11-04: 15 mL via OROMUCOSAL

## 2020-11-04 MED ORDER — EPHEDRINE 5 MG/ML INJ
INTRAVENOUS | Status: AC
  Filled 2020-11-04: qty 10

## 2020-11-04 MED ORDER — ROCURONIUM BROMIDE 10 MG/ML (PF) SYRINGE
PREFILLED_SYRINGE | INTRAVENOUS | Status: AC
  Filled 2020-11-04: qty 10

## 2020-11-04 MED ORDER — IOHEXOL 300 MG/ML  SOLN
INTRAMUSCULAR | Status: DC | PRN
  Administered 2020-11-04: 10 mL

## 2020-11-04 MED ORDER — 0.9 % SODIUM CHLORIDE (POUR BTL) OPTIME
TOPICAL | Status: DC | PRN
Start: 2020-11-04 — End: 2020-11-04
  Administered 2020-11-04: 1000 mL

## 2020-11-04 MED ORDER — ACETAMINOPHEN 500 MG PO TABS
1000.0000 mg | ORAL_TABLET | ORAL | Status: AC
  Administered 2020-11-04: 1000 mg via ORAL

## 2020-11-04 MED ORDER — ACETAMINOPHEN 500 MG PO TABS
500.0000 mg | ORAL_TABLET | Freq: Four times a day (QID) | ORAL | Status: DC
  Administered 2020-11-04 – 2020-11-06 (×8): 500 mg via ORAL
  Filled 2020-11-04 (×8): qty 1

## 2020-11-04 MED ORDER — FENTANYL CITRATE (PF) 100 MCG/2ML IJ SOLN
INTRAMUSCULAR | Status: AC
  Filled 2020-11-04: qty 2

## 2020-11-04 MED ORDER — ONDANSETRON HCL 4 MG/2ML IJ SOLN
INTRAMUSCULAR | Status: DC | PRN
  Administered 2020-11-04: 4 mg via INTRAVENOUS

## 2020-11-04 MED ORDER — BUPIVACAINE-EPINEPHRINE (PF) 0.25% -1:200000 IJ SOLN
INTRAMUSCULAR | Status: AC
  Filled 2020-11-04: qty 30

## 2020-11-04 MED ORDER — ONDANSETRON HCL 4 MG/2ML IJ SOLN
4.0000 mg | Freq: Four times a day (QID) | INTRAMUSCULAR | Status: DC | PRN
  Administered 2020-11-04 (×2): 4 mg via INTRAVENOUS
  Filled 2020-11-04 (×2): qty 2

## 2020-11-04 MED ORDER — SODIUM CHLORIDE 0.9 % IV SOLN
1.0000 g | Freq: Three times a day (TID) | INTRAVENOUS | Status: DC
  Administered 2020-11-04 – 2020-11-06 (×7): 1 g via INTRAVENOUS
  Filled 2020-11-04: qty 1
  Filled 2020-11-04: qty 0.88
  Filled 2020-11-04: qty 1
  Filled 2020-11-04: qty 0.88
  Filled 2020-11-04 (×4): qty 1

## 2020-11-04 MED ORDER — LIDOCAINE HCL (PF) 2 % IJ SOLN
INTRAMUSCULAR | Status: AC
  Filled 2020-11-04: qty 10

## 2020-11-04 MED ORDER — ENOXAPARIN SODIUM 30 MG/0.3ML ~~LOC~~ SOLN
30.0000 mg | Freq: Two times a day (BID) | SUBCUTANEOUS | Status: DC
  Administered 2020-11-05 (×2): 30 mg via SUBCUTANEOUS
  Filled 2020-11-04 (×3): qty 0.3

## 2020-11-04 MED ORDER — EPHEDRINE SULFATE-NACL 50-0.9 MG/10ML-% IV SOSY
PREFILLED_SYRINGE | INTRAVENOUS | Status: DC | PRN
  Administered 2020-11-04: 10 mg via INTRAVENOUS
  Administered 2020-11-04: 5 mg via INTRAVENOUS
  Administered 2020-11-04: 10 mg via INTRAVENOUS

## 2020-11-04 MED ORDER — BUPIVACAINE-EPINEPHRINE 0.25% -1:200000 IJ SOLN
INTRAMUSCULAR | Status: DC | PRN
  Administered 2020-11-04: 30 mL

## 2020-11-04 MED ORDER — LIDOCAINE 5 % EX PTCH
1.0000 | MEDICATED_PATCH | CUTANEOUS | Status: DC
  Filled 2020-11-04 (×2): qty 1

## 2020-11-04 MED ORDER — ACETAMINOPHEN 500 MG PO TABS
1000.0000 mg | ORAL_TABLET | Freq: Once | ORAL | Status: DC
  Filled 2020-11-04: qty 2

## 2020-11-04 MED ORDER — METHOCARBAMOL 500 MG PO TABS: 750.0000 mg | ORAL_TABLET | Freq: Four times a day (QID) | ORAL | Status: DC | PRN

## 2020-11-04 MED ORDER — KETOROLAC TROMETHAMINE 15 MG/ML IJ SOLN: 15.0000 mg | INTRAMUSCULAR | Status: DC

## 2020-11-04 MED ORDER — LACTATED RINGERS IV SOLN: INTRAVENOUS | Status: DC | PRN

## 2020-11-04 MED ORDER — FENTANYL CITRATE (PF) 100 MCG/2ML IJ SOLN: 25.0000 ug | INTRAMUSCULAR | Status: DC | PRN

## 2020-11-04 MED ORDER — OXYCODONE HCL 5 MG PO TABS
5.0000 mg | ORAL_TABLET | ORAL | Status: DC | PRN
  Administered 2020-11-04 – 2020-11-05 (×2): 10 mg via ORAL
  Filled 2020-11-04 (×2): qty 2

## 2020-11-04 MED ORDER — DEXAMETHASONE SODIUM PHOSPHATE 10 MG/ML IJ SOLN
INTRAMUSCULAR | Status: AC
  Filled 2020-11-04: qty 1

## 2020-11-04 MED ORDER — GABAPENTIN 300 MG PO CAPS
300.0000 mg | ORAL_CAPSULE | ORAL | Status: AC
  Administered 2020-11-04: 300 mg via ORAL

## 2020-11-04 MED ORDER — LIDOCAINE HCL (CARDIAC) PF 100 MG/5ML IV SOSY
PREFILLED_SYRINGE | INTRAVENOUS | Status: DC | PRN
  Administered 2020-11-04: 100 mg via INTRAVENOUS

## 2020-11-04 MED ORDER — DEXAMETHASONE SODIUM PHOSPHATE 10 MG/ML IJ SOLN
INTRAMUSCULAR | Status: DC | PRN
  Administered 2020-11-04: 10 mg via INTRAVENOUS

## 2020-11-04 MED ORDER — PROPOFOL 10 MG/ML IV BOLUS
INTRAVENOUS | Status: DC | PRN
  Administered 2020-11-04: 150 mg via INTRAVENOUS

## 2020-11-04 MED ORDER — LACTATED RINGERS IV SOLN: Freq: Once | INTRAVENOUS | Status: AC

## 2020-11-04 MED ORDER — SUGAMMADEX SODIUM 200 MG/2ML IV SOLN
INTRAVENOUS | Status: DC | PRN
  Administered 2020-11-04: 250 mg via INTRAVENOUS

## 2020-11-04 SURGICAL SUPPLY — 51 items
APPLIER CLIP 5 13 M/L LIGAMAX5 (MISCELLANEOUS) ×3
APPLIER CLIP ROT 10 11.4 M/L (STAPLE)
CABLE HIGH FREQUENCY MONO STRZ (ELECTRODE) ×3 IMPLANT
CHLORAPREP W/TINT 26 (MISCELLANEOUS) ×3 IMPLANT
CHOLANGIOGRAM CATH TAUT (CATHETERS) ×3 IMPLANT
CLIP APPLIE 5 13 M/L LIGAMAX5 (MISCELLANEOUS) ×1 IMPLANT
CLIP APPLIE ROT 10 11.4 M/L (STAPLE) IMPLANT
COVER MAYO STAND STRL (DRAPES) ×3 IMPLANT
COVER SURGICAL LIGHT HANDLE (MISCELLANEOUS) ×3 IMPLANT
COVER WAND RF STERILE (DRAPES) IMPLANT
DECANTER SPIKE VIAL GLASS SM (MISCELLANEOUS) ×3 IMPLANT
DERMABOND ADVANCED (GAUZE/BANDAGES/DRESSINGS) ×2
DERMABOND ADVANCED .7 DNX12 (GAUZE/BANDAGES/DRESSINGS) ×1 IMPLANT
DRAIN CHANNEL 19F RND (DRAIN) ×3 IMPLANT
DRAPE C-ARM 42X120 X-RAY (DRAPES) ×3 IMPLANT
DRSG TEGADERM 4X4.75 (GAUZE/BANDAGES/DRESSINGS) ×3 IMPLANT
ELECT PENCIL ROCKER SW 15FT (MISCELLANEOUS) ×3 IMPLANT
ELECT REM PT RETURN 15FT ADLT (MISCELLANEOUS) ×3 IMPLANT
ENDOLOOP SUT PDS II  0 18 (SUTURE) ×3
ENDOLOOP SUT PDS II 0 18 (SUTURE) ×1 IMPLANT
EVACUATOR SILICONE 100CC (DRAIN) ×3 IMPLANT
GAUZE SPONGE 2X2 8PLY STRL LF (GAUZE/BANDAGES/DRESSINGS) ×1 IMPLANT
GLOVE SURG SYN 7.5  E (GLOVE) ×3
GLOVE SURG SYN 7.5 E (GLOVE) ×1 IMPLANT
GOWN STRL REUS W/TWL XL LVL3 (GOWN DISPOSABLE) ×9 IMPLANT
GRASPER SUT TROCAR 14GX15 (MISCELLANEOUS) IMPLANT
HEMOSTAT SURGICEL 4X8 (HEMOSTASIS) IMPLANT
IV CATH 14GX2 1/4 (CATHETERS) ×3 IMPLANT
IV SET EXTENSION CATH 6 NF (IV SETS) ×3 IMPLANT
KIT BASIN OR (CUSTOM PROCEDURE TRAY) ×3 IMPLANT
KIT TURNOVER KIT A (KITS) ×3 IMPLANT
POUCH RETRIEVAL ECOSAC 10 (ENDOMECHANICALS) ×1 IMPLANT
POUCH RETRIEVAL ECOSAC 10MM (ENDOMECHANICALS) ×3
SCISSORS LAP 5X35 DISP (ENDOMECHANICALS) ×3 IMPLANT
SET IRRIG TUBING LAPAROSCOPIC (IRRIGATION / IRRIGATOR) ×3 IMPLANT
SET TUBE SMOKE EVAC HIGH FLOW (TUBING) ×3 IMPLANT
SLEEVE ADV FIXATION 5X100MM (TROCAR) ×3 IMPLANT
SPONGE GAUZE 2X2 STER 10/PKG (GAUZE/BANDAGES/DRESSINGS) ×2
STOPCOCK 4 WAY LG BORE MALE ST (IV SETS) ×3 IMPLANT
SUT ETHILON 2 0 PS N (SUTURE) ×3 IMPLANT
SUT MNCRL AB 4-0 PS2 18 (SUTURE) ×6 IMPLANT
SUT VIC AB 2-0 SH 27 (SUTURE) ×3
SUT VIC AB 2-0 SH 27X BRD (SUTURE) ×1 IMPLANT
SYR 10ML ECCENTRIC (SYRINGE) ×3 IMPLANT
TOWEL OR 17X26 10 PK STRL BLUE (TOWEL DISPOSABLE) ×3 IMPLANT
TOWEL OR NON WOVEN STRL DISP B (DISPOSABLE) ×3 IMPLANT
TRAY LAPAROSCOPIC (CUSTOM PROCEDURE TRAY) ×3 IMPLANT
TROCAR ADV FIXATION 11X100MM (TROCAR) IMPLANT
TROCAR ADV FIXATION 5X100MM (TROCAR) ×3 IMPLANT
TROCAR XCEL BLUNT TIP 100MML (ENDOMECHANICALS) ×3 IMPLANT
TUBING ENDO SMARTCAP (MISCELLANEOUS) ×3 IMPLANT

## 2020-11-04 NOTE — Progress Notes (Signed)
PROGRESS NOTE    [name]   YDX:[id number]  DOB: 03/24/1946  DOA: 11/03/2020     0  PCP: Shirline Frees, MD  CC: abdominal pain, nausea, fevers/chills  Hospital Course: Mr. Lawrence Wheeler is a 74 yo male with PMH obesity, HTN, GERD, OA who presented to [facility] after no improvement with epigastric pain that started Tuesday night following CLN that same day.  He has had worsening pain, subjective fevers/chills, SOB 2/2 pain, and ongoing nausea. No diarrhea.  He also reports no bowel movement since Tuesday nor flatus.  In the ER he was found to have elevated LFTs and TB.  CT abd/pelvis showed a distended GB with essentially pericholecystic fluid. A HIDA was ordered and patient was admitted and started on meropenem.  The morning after admission, surgery was consulted. HIDA was canceled and instead patient was felt more appropriate for undergoing CCY given his clinical presentation.    Interval History:  Patient seen this am with wife bedside; he is obviously uncomfortable in bed and in noticeable pain, not wanting to move much. We reviewed history as noted above.  After  Old records reviewed in assessment of this patient  ROS: Constitutional: positive for anorexia, chills, fevers and malaise, negative for night sweats, Respiratory: negative for cough and dyspnea on exertion, Cardiovascular: negative for chest pain and Gastrointestinal: positive for abdominal pain, constipation, jaundice and nausea, negative for diarrhea, melena and vomiting  Assessment & Plan: * Acute cholecystitis - patient has hepatic steatosis but no obvious stones or sludge seen on U/S or CT; but clinically to me he appeared to be more consistent with acute cholecystitis this morning given abd pain, subjective fevers/chills, obstructive pattern, and leukocytosis  - I have asked surgery to see given concern for needing surgery sooner than later; appreciate consult - plan now is lap chole with IOC; and still possible  need for ERCP with GI depending on OR findings  - continue IVF, pain management, anti-emetics -Continue meropenem -NPO for now pending clinical course   Abdominal distension - patient endorses nausea, no flatus or BM since Tues but also not eating much; no obvious colonic distension on my review of CT nor mention of obstruction or ileus - patient is s/p CLN on 12/7 as well - surgery plans to evaluate as well for any pathology while in OR - further plan pending OR findings   Obstructive jaundice - see acute cholecystitis; suspicion is sludge vs passed stone (no MRCP done just CT on admission - greatly appreciate GI and surgical involvement  Obesity, Class III, BMI 40-49.9 (morbid obesity) (HCC) -Weight loss and lifestyle modification will be recommended prior to discharge  Essential hypertension - IV labetalol or hydralazine for now until able to take PO again     Antimicrobials: Meropenem 11/04/20>>current  DVT prophylaxis: SCD for now; likely lovenox after surgery Code Status: Full Family Communication: wife bedside Disposition Plan: Status is: Inpatient  Remains inpatient appropriate because:Ongoing diagnostic testing needed not appropriate for outpatient work up, IV treatments appropriate due to intensity of illness or inability to take PO and Inpatient level of care appropriate due to severity of illness   Dispo: The patient is from: Home              Anticipated d/c is to: Home              Anticipated d/c date is: > 3 days              Patient currently is  not medically stable to d/c.       Objective: Blood pressure (!) 186/90, pulse (!) 103, temperature (!) 101.6 F (38.7 C), temperature source Oral, resp. rate 17, height 5\' 8"  (1.727 m), weight 122.5 kg, SpO2 92 %.  Examination: General appearance: pleasant but obviously uncomfortable man laying in bed in pain and discomfort; wife bedside Head: Normocephalic, without obvious abnormality, atraumatic Eyes:  EOMI; scleral icterus Lungs: clear to auscultation bilaterally Heart: regular rate and rhythm and S1, S2 normal Abdomen: obese, mild distension, TTP throughout but not rigid, very hypoactive bowel sounds Extremities: no edema Skin: grossly jaundiced Neurologic: no focal deficits, no confusion   Consultants:   GI  Surgery  Procedures:     Data Reviewed: I have personally reviewed following labs and imaging studies Results for orders placed or performed during the hospital encounter of 11/03/20 (from the past 24 hour(s))  Resp Panel by RT-PCR (Flu A&B, Covid) Nasopharyngeal Swab     Status: None   Collection Time: 11/03/20  4:36 PM   Specimen: Nasopharyngeal Swab; Nasopharyngeal(NP) swabs in vial transport medium  Result Value Ref Range   SARS Coronavirus 2 by RT PCR NEGATIVE NEGATIVE   Influenza A by PCR NEGATIVE NEGATIVE   Influenza B by PCR NEGATIVE NEGATIVE  Troponin I (High Sensitivity)     Status: None   Collection Time: 11/03/20 10:39 PM  Result Value Ref Range   Troponin I (High Sensitivity) 9 <18 ng/L  Glucose, capillary     Status: Abnormal   Collection Time: 11/04/20  4:02 AM  Result Value Ref Range   Glucose-Capillary 112 (H) 70 - 99 mg/dL  Hepatic function panel     Status: Abnormal   Collection Time: 11/04/20  6:45 AM  Result Value Ref Range   Total Protein 7.1 6.5 - 8.1 g/dL   Albumin 3.4 (L) 3.5 - 5.0 g/dL   AST 142 (H) 15 - 41 U/L   ALT 227 (H) 0 - 44 U/L   Alkaline Phosphatase 124 38 - 126 U/L   Total Bilirubin 9.9 (H) 0.3 - 1.2 mg/dL   Bilirubin, Direct 6.3 (H) 0.0 - 0.2 mg/dL   Indirect Bilirubin 3.6 (H) 0.3 - 0.9 mg/dL  Basic metabolic panel     Status: Abnormal   Collection Time: 11/04/20  6:45 AM  Result Value Ref Range   Sodium 137 135 - 145 mmol/L   Potassium 3.5 3.5 - 5.1 mmol/L   Chloride 104 98 - 111 mmol/L   CO2 24 22 - 32 mmol/L   Glucose, Bld 138 (H) 70 - 99 mg/dL   BUN 13 8 - 23 mg/dL   Creatinine, Ser 0.85 0.61 - 1.24 mg/dL    Calcium 8.3 (L) 8.9 - 10.3 mg/dL   GFR, Estimated >60 >60 mL/min   Anion gap 9 5 - 15  CBC     Status: Abnormal   Collection Time: 11/04/20  6:45 AM  Result Value Ref Range   WBC 9.4 4.0 - 10.5 K/uL   RBC 4.13 (L) 4.22 - 5.81 MIL/uL   Hemoglobin 14.0 13.0 - 17.0 g/dL   HCT 41.9 39.0 - 52.0 %   MCV 101.5 (H) 80.0 - 100.0 fL   MCH 33.9 26.0 - 34.0 pg   MCHC 33.4 30.0 - 36.0 g/dL   RDW 13.6 11.5 - 15.5 %   Platelets 152 150 - 400 K/uL   nRBC 0.0 0.0 - 0.2 %  Glucose, capillary     Status: Abnormal  Collection Time: 11/04/20  7:58 AM  Result Value Ref Range   Glucose-Capillary 116 (H) 70 - 99 mg/dL    Recent Results (from the past 240 hour(s))  Resp Panel by RT-PCR (Flu A&B, Covid) Nasopharyngeal Swab     Status: None   Collection Time: 11/03/20  4:36 PM   Specimen: Nasopharyngeal Swab; Nasopharyngeal(NP) swabs in vial transport medium  Result Value Ref Range Status   SARS Coronavirus 2 by RT PCR NEGATIVE NEGATIVE Final    Comment: (NOTE) SARS-CoV-2 target nucleic acids are NOT DETECTED.  The SARS-CoV-2 RNA is generally detectable in upper respiratory specimens during the acute phase of infection. The lowest concentration of SARS-CoV-2 viral copies this assay can detect is 138 copies/mL. A negative result does not preclude SARS-Cov-2 infection and should not be used as the sole basis for treatment or other patient management decisions. A negative result may occur with  improper specimen collection/handling, submission of specimen other than nasopharyngeal swab, presence of viral mutation(s) within the areas targeted by this assay, and inadequate number of viral copies(<138 copies/mL). A negative result must be combined with clinical observations, patient history, and epidemiological information. The expected result is Negative.  Fact Sheet for Patients:  EntrepreneurPulse.com.au  Fact Sheet for Healthcare Providers:   IncredibleEmployment.be  This test is no t yet approved or cleared by the [place] FDA and  has been authorized for detection and/or diagnosis of SARS-CoV-2 by FDA under an Emergency Use Authorization (EUA). This EUA will remain  in effect (meaning this test can be used) for the duration of the COVID-19 declaration under Section 564(b)(1) of the Act, 21 U.S.C.section 360bbb-3(b)(1), unless the authorization is terminated  or revoked sooner.       Influenza A by PCR NEGATIVE NEGATIVE Final   Influenza B by PCR NEGATIVE NEGATIVE Final    Comment: (NOTE) The Xpert Xpress SARS-CoV-2/FLU/RSV plus assay is intended as an aid in the diagnosis of influenza from Nasopharyngeal swab specimens and should not be used as a sole basis for treatment. Nasal washings and aspirates are unacceptable for Xpert Xpress SARS-CoV-2/FLU/RSV testing.  Fact Sheet for Patients: EntrepreneurPulse.com.au  Fact Sheet for Healthcare Providers: IncredibleEmployment.be  This test is not yet approved or cleared by the [place] FDA and has been authorized for detection and/or diagnosis of SARS-CoV-2 by FDA under an Emergency Use Authorization (EUA). This EUA will remain in effect (meaning this test can be used) for the duration of the COVID-19 declaration under Section 564(b)(1) of the Act, 21 U.S.C. section 360bbb-3(b)(1), unless the authorization is terminated or revoked.  Performed at [facility], [address] [place], [place] [address]      Radiology Studies: DG Chest 2 View  Result Date: 11/03/2020 CLINICAL DATA:  Chest/epigastric pain. EXAM: CHEST - 2 VIEW COMPARISON:  March 04, 2010. FINDINGS: Borderline enlarged cardiac silhouette. Tortuous aorta. Low lung volumes. Both lungs are clear. No visible pleural effusions or pneumothorax. No acute osseous abnormality. IMPRESSION: No active cardiopulmonary disease. Electronically  Signed   By: Margaretha Sheffield MD   On: 11/03/2020 11:19   CT Angio Chest PE W and/or Wo Contrast  Result Date: 11/03/2020 CLINICAL DATA:  Epigastric pain, transaminitis. Shortness of breath. EXAM: CT ANGIOGRAPHY CHEST WITH CONTRAST TECHNIQUE: Multidetector CT imaging of the chest was performed using the standard protocol during bolus administration of intravenous contrast. Multiplanar CT image reconstructions and MIPs were obtained to evaluate the vascular anatomy. CONTRAST:  16mL OMNIPAQUE IOHEXOL 350 MG/ML SOLN COMPARISON:  None. FINDINGS: Cardiovascular: No filling defects in the pulmonary arteries to suggest pulmonary emboli. Heart is normal size. Calcifications throughout the left anterior descending coronary artery. Scattered aortic calcifications. No aneurysm. Mediastinum/Nodes: Small scattered and borderline sized mediastinal lymph nodes, likely reactive, none pathologically enlarged. Trachea and esophagus are unremarkable. Thyroid unremarkable. Lungs/Pleura: Linear dependent densities in the lower lobes compatible with atelectasis. No effusions. No confluent opacities or suspicious pulmonary nodules. Upper Abdomen: Gallbladder moderately distended. Musculoskeletal: Chest wall soft tissues are unremarkable. No acute bony abnormality. Review of the MIP images confirms the above findings. IMPRESSION: No evidence of pulmonary embolus. Dependent atelectasis in the lower lobes. Coronary artery disease. Gallbladder distension.  See abdominal CT report today. Aortic Atherosclerosis (ICD10-I70.0). Electronically Signed   By: Rolm Baptise M.D.   On: 11/03/2020 14:59   CT ABDOMEN PELVIS W CONTRAST  Result Date: 11/03/2020 CLINICAL DATA:  Epigastric pain, transaminitis.  Recent colonoscopy. EXAM: CT ABDOMEN AND PELVIS WITH CONTRAST TECHNIQUE: Multidetector CT imaging of the abdomen and pelvis was performed using the standard protocol following bolus administration of intravenous contrast. CONTRAST:  126mL  OMNIPAQUE IOHEXOL 350 MG/ML SOLN COMPARISON:  04/07/2010 FINDINGS: Lower chest: Dependent atelectasis in the lung bases.  No effusions. Hepatobiliary: Gallbladder is distended. Possible slight surrounding stranding or fluid. No focal hepatic abnormality. No biliary ductal dilatation. Pancreas: No focal abnormality or ductal dilatation. Spleen: No focal abnormality.  Normal size. Adrenals/Urinary Tract: No adrenal abnormality. No focal renal abnormality. No stones or hydronephrosis. Urinary bladder is unremarkable. Stomach/Bowel: Postoperative changes in the sigmoid colon. Normal appendix. Stomach and small bowel decompressed, unremarkable. Vascular/Lymphatic: Aortic calcifications. No evidence of aneurysm or adenopathy. Reproductive: No visible focal abnormality. Other: No free fluid or free air. Small bilateral inguinal hernias and umbilical hernia containing fat. Musculoskeletal: No acute bony abnormality. IMPRESSION: Mild distention of the gallbladder with possible slight surrounding stranding or fluid. Consider further evaluation with right upper quadrant ultrasound. No free air following colonoscopy. Umbilical and bilateral inguinal hernias containing fat. Aortic atherosclerosis. Electronically Signed   By: Rolm Baptise M.D.   On: 11/03/2020 15:02   US Abdomen Limited  Result Date: 11/03/2020 CLINICAL DATA:  ruq pain, trasnaminitis, elevated bili EXAM: ULTRASOUND ABDOMEN LIMITED RIGHT UPPER QUADRANT COMPARISON:  04/07/2010 and prior. FINDINGS: Gallbladder: No gallstones or wall thickening visualized. No sonographic Gorum sign noted by sonographer. Common bile duct: Diameter: 5.3 mm Liver: No focal lesion identified. Increased parenchymal echogenicity. Portal vein is patent on color Doppler imaging with normal direction of blood flow towards the liver. Other: None. IMPRESSION: Hepatic steatosis. Otherwise unremarkable right upper quadrant ultrasound. Electronically Signed   By: Primitivo Gauze M.D.    On: 11/03/2020 12:57   CT Angio Chest PE W and/or Wo Contrast  Final Result    CT ABDOMEN PELVIS W CONTRAST  Final Result    US Abdomen Limited  Final Result    DG Chest 2 View  Final Result      Scheduled Meds: . acetaminophen  1,000 mg Oral Once  . gabapentin      . ketorolac  15 mg Intravenous On Call to OR   PRN Meds: [MAR Hold] hydrALAZINE, [MAR Hold]  HYDROmorphone (DILAUDID) injection, [MAR Hold] ondansetron (ZOFRAN) IV Continuous Infusions: . dextrose 5 % and 0.9% NaCl 125 mL/hr at 11/04/20 0651  . [MAR Hold] meropenem (MERREM) IV 1 g (11/04/20 0709)     LOS: 0 days  Time spent: Greater than 50% of the 35 minute visit was spent in counseling/coordination of care  for the patient as laid out in the A&P.   Dwyane Dee, MD [facility] 11/04/2020, 2:06 PM

## 2020-11-04 NOTE — Assessment & Plan Note (Addendum)
-   patient has hepatic steatosis but no obvious stones or sludge seen on U/S or CT; but clinically to me he appeared to be more consistent with acute cholecystitis. He was evaluated by surgery and taken to the OR the same day for lap chole - diet advanced patient tolerated; ambulated well - drain removed on 12/12 and no abx needed at discharge - needs outpt repeat CMP/LFTs; instructed him to hold off on resuming statin until seen by PCP and having labs checked

## 2020-11-04 NOTE — Consult Note (Addendum)
Consult Note  NICHLAS PITERA 03/13/1946  [id number].    Requesting MD: Girguis Chief Complaint/Reason for Consult: Elevated liver function tests with RUQ pain   HPI:  Patient is a 74 year old male who presented tp [facility] with epigastric pain x48 hrs. Recent colonoscopy 11/01/20 at [facility] by Dr. Paulita Fujita (s/p removal rectal polyp, path w/o malignancy/dysplasia). Patient started developing abdominal pain and subjective fever and chills later that evening. Patient initially called EMS 12/7 but EKG was unremarkable and patient decided to stay home. Pain continued and patient called [facility] and was instructed to come the ED for evaluation. Pain exacerbated by eating and associated with mild SOB and nausea. Denies diarrhea, urinary symptoms. Patient denies a history of similar pain or of gastric ulcers, states he takes Advil daily for knee pain.  PMH significant for HTN, HLD, GERD, and obesity. Past abdominal surgery includes partial colectomy by Dr. Johney Maine for diverticular disease. Allergies to PCNs, flagyl, cipro, sulfa, doxycycline and prednisone. Patient is not on any blood thinners. Patients wife reports that he drinks about 5/7 days per week, varying from 1 glass to 1 bottle of wine. Has not had alcohol for over one week now. denies tobacco or illicit drug use.   ROS: Review of Systems  Constitutional: Positive for chills and fever.  Gastrointestinal: Positive for abdominal pain and nausea. Negative for diarrhea.  All other systems reviewed and are negative.   Family History  Problem Relation Age of Onset  . Osteoarthritis Mother   . Cancer Mother   . Osteoarthritis Sister   . Osteoarthritis Brother   . Heart disease Brother   . Diabetes Paternal Grandfather   . Healthy Child     Past Medical History:  Diagnosis Date  . Cataract    mild  . Colon polyp   . Diverticulitis   . GERD (gastroesophageal reflux disease)   . Hearing loss    bilateral- hearing aids  .  Hypertension   . Morbid obesity ([place])   . OA (osteoarthritis)    knees    Past Surgical History:  Procedure Laterality Date  . BACK SURGERY    . COLONOSCOPY W/ POLYPECTOMY    . COLONOSCOPY WITH PROPOFOL N/A 01/23/2016   Procedure: COLONOSCOPY WITH PROPOFOL;  Surgeon: Laurence Spates, MD;  Location: [facility];  Service: Endoscopy;  Laterality: N/A;  . HOT HEMOSTASIS N/A 01/23/2016   Procedure: HOT HEMOSTASIS (ARGON PLASMA COAGULATION/BICAP);  Surgeon: Laurence Spates, MD;  Location: [facility];  Service: Endoscopy;  Laterality: N/A;  . JOINT REPLACEMENT Right    RTKA 1'16  . KNEE ARTHROSCOPY     left   . PARTIAL COLECTOMY     "diverticulitis"  . TOTAL KNEE ARTHROPLASTY Right 12/20/2014   Procedure: RIGHT TOTAL KNEE ARTHROPLASTY;  Surgeon: [name], MD;  Location: [facility];  Service: Orthopedics;  Laterality: Right;    Social History:  reports that he has never smoked. He has never used smokeless tobacco. He reports current alcohol use. He reports that he does not use drugs.  Allergies:  Allergies  Allergen Reactions  . Flagyl [Metronidazole] Nausea Only  . Penicillins Other (See Comments)    REACTION: rash and swelling  Has patient had a PCN reaction causing immediate rash, facial/tongue/throat swelling, SOB or lightheadedness with hypotension: Yes Has patient had a PCN reaction causing severe rash involving mucus membranes or skin necrosis: Yes Has patient had a PCN reaction that required hospitalization No Has patient had a  PCN reaction occurring within the last 10 years: Yes If all of the above answers are "NO", then may proceed with Cephalosporin use.   Marland Kitchen Doxycycline Nausea Only  . Prednisone Nausea Only  . Sulfa Antibiotics Nausea Only  . Ciprofloxacin Nausea And Vomiting    Medications Prior to Admission  Medication Sig Dispense Refill  . amLODipine (NORVASC) 5 MG tablet Take 1 tablet (5 mg total) by mouth daily. (Patient taking differently: Take 5 mg by mouth  every evening.) 30 tablet 11  . atorvastatin (LIPITOR) 20 MG tablet Take 20 mg by mouth every evening.     Marland Kitchen ibuprofen (ADVIL) 200 MG tablet Take 800 mg by mouth 2 (two) times daily as needed (ARTHRITIS PAIN).    Marland Kitchen pantoprazole (PROTONIX) 40 MG tablet Take 40 mg by mouth every morning.       Blood pressure (!) 154/89, pulse 75, temperature 98.3 F (36.8 C), temperature source Oral, resp. rate 16, height _0  (1.727 m), weight 122.5 kg, SpO2 93 %. Physical Exam:  General: pleasant, obese male who is laying in bed, appears uncomfortable, laying very still. +jaundice HEENT: head is normocephalic, atraumatic.  Sclera are icteric.  PERRL.  Ears and nose without any masses or lesions.  Mouth is jaundiced and moist Heart: regular, rate, and rhythm.  Normal s1,s2. No obvious murmurs, gallops, or rubs noted.  Palpable radial and pedal pulses bilaterally Lungs: CTAB, no wheezes, rhonchi, or rales noted.  Respiratory effort nonlabored Abd: soft, obese, tender to light palpation of RUQ, epigastrium, and RLQ without guarding, hypoactive bowel sounds; reducible umbilical hernia present. MS: all 4 extremities are symmetrical with no cyanosis, clubbing, or edema. Skin: warm and dry with no masses, lesions, or rashes Neuro: Cranial nerves 2-12 grossly intact, sensation is normal throughout Psych: A&Ox3 with an appropriate affect.  Results for orders placed or performed during the hospital encounter of 11/03/20 (from the past 48 hour(s))  Basic metabolic panel     Status: Abnormal   Collection Time: 11/03/20 10:50 AM  Result Value Ref Range   Sodium 134 (L) 135 - 145 mmol/L   Potassium 3.7 3.5 - 5.1 mmol/L   Chloride 101 98 - 111 mmol/L   CO2 24 22 - 32 mmol/L   Glucose, Bld 143 (H) 70 - 99 mg/dL    Comment: Glucose reference range applies only to samples taken after fasting for at least 8 hours.   BUN 14 8 - 23 mg/dL   Creatinine, Ser 0.99 0.61 - 1.24 mg/dL   Calcium 8.6 (L) 8.9 - 10.3 mg/dL   GFR,  Estimated >60 >60 mL/min    Comment: (NOTE) Calculated using the CKD-EPI Creatinine Equation (2021)    Anion gap 9 5 - 15    Comment: Performed at [facility], [address] [place], [place] [address]  CBC     Status: Abnormal   Collection Time: 11/03/20 10:50 AM  Result Value Ref Range   WBC 11.7 (H) 4.0 - 10.5 K/uL   RBC 4.37 4.22 - 5.81 MIL/uL   Hemoglobin 14.8 13.0 - 17.0 g/dL   HCT 41.7 39.0 - 52.0 %   MCV 95.4 80.0 - 100.0 fL   MCH 33.9 26.0 - 34.0 pg   MCHC 35.5 30.0 - 36.0 g/dL   RDW 13.2 11.5 - 15.5 %   Platelets 168 150 - 400 K/uL   nRBC 0.0 0.0 - 0.2 %    Comment: Performed at [facility], [address] [place], [place] [address]  Troponin I (High Sensitivity)     Status: None   Collection Time: 11/03/20 10:50 AM  Result Value Ref Range   Troponin I (High Sensitivity) 11 <18 ng/L    Comment: (NOTE) Elevated high sensitivity troponin I (hsTnI) values and significant  changes across serial measurements may suggest ACS but many other  chronic and acute conditions are known to elevate hsTnI results.  Refer to the "Links" section for chest pain algorithms and additional  guidance. Performed at [facility], [address] [place], [place] [address]   Hepatic function panel     Status: Abnormal   Collection Time: 11/03/20 10:50 AM  Result Value Ref Range   Total Protein 7.5 6.5 - 8.1 g/dL   Albumin 3.7 3.5 - 5.0 g/dL   AST 191 (H) 15 - 41 U/L   ALT 298 (H) 0 - 44 U/L   Alkaline Phosphatase 113 38 - 126 U/L   Total Bilirubin 9.5 (H) 0.3 - 1.2 mg/dL   Bilirubin, Direct 5.0 (H) 0.0 - 0.2 mg/dL   Indirect Bilirubin 4.5 (H) 0.3 - 0.9 mg/dL    Comment: Performed at [facility], [address] [place], [place] [address]  Lipase, blood     Status: None   Collection Time: 11/03/20 10:50 AM  Result Value Ref Range   Lipase 22 11 - 51 U/L    Comment: Performed at [facility], [address] [place],  [place] [address]  D-dimer, quantitative (not at [facility])     Status: Abnormal   Collection Time: 11/03/20 11:34 AM  Result Value Ref Range   D-Dimer, Quant 5.03 (H) 0.00 - 0.50 ug/mL-FEU    Comment: (NOTE) At the manufacturer cut-off value of 0.5 g/mL FEU, this assay has a negative predictive value of 95-100%.This assay is intended for use in conjunction with a clinical pretest probability (PTP) assessment model to exclude pulmonary embolism (PE) and deep venous thrombosis (DVT) in outpatients suspected of PE or DVT. Results should be correlated with clinical presentation. Performed at [facility], [address] [place], [place] [address]   Urinalysis, Routine w reflex microscopic Urine, Clean Catch     Status: Abnormal   Collection Time: 11/03/20 11:34 AM  Result Value Ref Range   Color, Urine ORANGE (A) YELLOW    Comment: BIOCHEMICALS MAY BE AFFECTED BY COLOR   APPearance CLEAR CLEAR   Specific Gravity, Urine 1.010 1.005 - 1.030   pH 6.0 5.0 - 8.0   Glucose, UA NEGATIVE NEGATIVE mg/dL   Hgb urine dipstick TRACE (A) NEGATIVE   Bilirubin Urine MODERATE (A) NEGATIVE   Ketones, ur NEGATIVE NEGATIVE mg/dL   Protein, ur NEGATIVE NEGATIVE mg/dL   Nitrite NEGATIVE NEGATIVE   Leukocytes,Ua TRACE (A) NEGATIVE    Comment: Performed at [facility], [address] [place], [place] [address]  Urinalysis, Microscopic (reflex)     Status: Abnormal   Collection Time: 11/03/20 11:34 AM  Result Value Ref Range   RBC / HPF 0-5 0 - 5 RBC/hpf   WBC, UA 6-10 0 - 5 WBC/hpf   Bacteria, UA FEW (A) NONE SEEN   Squamous Epithelial / LPF 0-5 0 - 5    Comment: Performed at [facility], [address] [place], [place] [address]  Protime-INR     Status: None   Collection Time: 11/03/20 11:34 AM  Result  Value Ref Range   Prothrombin Time 15.0 11.4 - 15.2 seconds   INR 1.2 0.8 - 1.2    Comment: (NOTE) INR goal varies based on device and disease states. Performed at [facility], [address] [place], [place] [address]   Troponin I (High Sensitivity)     Status: None   Collection Time: 11/03/20  1:10 PM  Result Value Ref Range   Troponin I (High Sensitivity) 11 <18 ng/L    Comment: (NOTE) Elevated high sensitivity troponin I (hsTnI) values and significant  changes across serial measurements may suggest ACS but many other  chronic and acute conditions are known to elevate hsTnI results.  Refer to the "Links" section for chest pain algorithms and additional  guidance. Performed at [facility], [address] [place], [place] [address]   Resp Panel by RT-PCR (Flu A&B, Covid) Nasopharyngeal Swab     Status: None   Collection Time: 11/03/20  4:36 PM   Specimen: Nasopharyngeal Swab; Nasopharyngeal(NP) swabs in vial transport medium  Result Value Ref Range   SARS Coronavirus 2 by RT PCR NEGATIVE NEGATIVE    Comment: (NOTE) SARS-CoV-2 target nucleic acids are NOT DETECTED.  The SARS-CoV-2 RNA is generally detectable in upper respiratory specimens during the acute phase of infection. The lowest concentration of SARS-CoV-2 viral copies this assay can detect is 138 copies/mL. A negative result does not preclude SARS-Cov-2 infection and should not be used as the sole basis for treatment or other patient management decisions. A negative result may occur with  improper specimen collection/handling, submission of specimen other than nasopharyngeal swab, presence of viral mutation(s) within the areas targeted by this assay, and inadequate number of viral copies(<138 copies/mL). A negative result must be combined with clinical observations, patient history, and epidemiological information. The expected result is Negative.  Fact Sheet for Patients:  EntrepreneurPulse.com.au  Fact Sheet for Healthcare Providers:  IncredibleEmployment.be  This test is no t yet approved or cleared by the [place] FDA and  has been authorized for detection and/or diagnosis of SARS-CoV-2 by FDA under an Emergency Use Authorization (EUA). This EUA will remain  in effect (meaning this test can be used) for the duration of the COVID-19 declaration under Section 564(b)(1) of the Act, 21 U.S.C.section 360bbb-3(b)(1), unless the authorization is terminated  or revoked sooner.       Influenza A by PCR NEGATIVE NEGATIVE   Influenza B by PCR NEGATIVE NEGATIVE    Comment: (NOTE) The Xpert Xpress SARS-CoV-2/FLU/RSV plus assay is intended as an aid in the diagnosis of influenza from Nasopharyngeal swab specimens and should not be used as a sole basis for treatment. Nasal washings and aspirates are unacceptable for Xpert Xpress SARS-CoV-2/FLU/RSV testing.  Fact Sheet for Patients: EntrepreneurPulse.com.au  Fact Sheet for Healthcare Providers: IncredibleEmployment.be  This test is not yet approved or cleared by the [place] FDA and has been authorized for detection and/or diagnosis of SARS-CoV-2 by FDA under an Emergency Use Authorization (EUA). This EUA will remain in effect (meaning this test can be used) for the duration of the COVID-19 declaration under Section 564(b)(1) of the Act, 21 U.S.C. section 360bbb-3(b)(1), unless the authorization is terminated or revoked.  Performed at [facility], [address] [place], [place] [address]   Troponin I (High Sensitivity)     Status: None   Collection Time: 11/03/20 10:39 PM  Result Value Ref Range   Troponin I (High Sensitivity) 9 <  18 ng/L    Comment: (NOTE) Elevated high sensitivity troponin I (hsTnI) values and significant  changes across serial measurements may suggest ACS but many other  chronic and acute conditions are known to elevate hsTnI results.  Refer to the "Links" section for chest pain algorithms and additional  guidance. Performed at [facility], [place]  [address], [place], [place] [address]   Glucose, capillary     Status: Abnormal   Collection Time: 11/04/20  4:02 AM  Result Value Ref Range   Glucose-Capillary 112 (H) 70 - 99 mg/dL    Comment: Glucose reference range applies only to samples taken after fasting for at least 8 hours.  Hepatic function panel     Status: Abnormal   Collection Time: 11/04/20  6:45 AM  Result Value Ref Range   Total Protein 7.1 6.5 - 8.1 g/dL   Albumin 3.4 (L) 3.5 - 5.0 g/dL   AST 142 (H) 15 - 41 U/L   ALT 227 (H) 0 - 44 U/L   Alkaline Phosphatase 124 38 - 126 U/L   Total Bilirubin 9.9 (H) 0.3 - 1.2 mg/dL   Bilirubin, Direct 6.3 (H) 0.0 - 0.2 mg/dL   Indirect Bilirubin 3.6 (H) 0.3 - 0.9 mg/dL    Comment: Performed at [facility], [place] [address], [place], [place] [address]  Basic metabolic panel     Status: Abnormal   Collection Time: 11/04/20  6:45 AM  Result Value Ref Range   Sodium 137 135 - 145 mmol/L   Potassium 3.5 3.5 - 5.1 mmol/L   Chloride 104 98 - 111 mmol/L   CO2 24 22 - 32 mmol/L   Glucose, Bld 138 (H) 70 - 99 mg/dL    Comment: Glucose reference range applies only to samples taken after fasting for at least 8 hours.   BUN 13 8 - 23 mg/dL   Creatinine, Ser 0.85 0.61 - 1.24 mg/dL   Calcium 8.3 (L) 8.9 - 10.3 mg/dL   GFR, Estimated >60 >60 mL/min    Comment: (NOTE) Calculated using the CKD-EPI Creatinine Equation (2021)    Anion gap 9 5 - 15    Comment: Performed at [facility], [place] [address] [place], [place] [address]  CBC     Status: Abnormal   Collection Time: 11/04/20  6:45 AM  Result Value Ref Range   WBC 9.4 4.0 - 10.5 K/uL   RBC 4.13 (L) 4.22 - 5.81 MIL/uL   Hemoglobin 14.0 13.0 - 17.0 g/dL   HCT 41.9 39.0 - 52.0 %   MCV 101.5 (H) 80.0 - 100.0 fL   MCH 33.9 26.0 - 34.0 pg   MCHC 33.4 30.0 - 36.0 g/dL   RDW 13.6 11.5 - 15.5 %   Platelets 152 150 - 400 K/uL   nRBC 0.0 0.0 - 0.2 %    Comment: Performed at [facility], [place] [address] [place], [place] [address]  Glucose, capillary     Status: Abnormal   Collection Time: 11/04/20  7:58 AM  Result Value Ref Range   Glucose-Capillary 116 (H) 70 - 99 mg/dL    Comment: Glucose reference range applies only to samples taken after fasting for at least 8 hours.   DG Chest 2 View  Result Date: 11/03/2020 CLINICAL DATA:  Chest/epigastric pain. EXAM: CHEST - 2 VIEW COMPARISON:  March 04, 2010. FINDINGS: Borderline enlarged cardiac silhouette. Tortuous aorta. Low lung volumes. Both lungs are clear. No visible pleural  effusions or pneumothorax. No acute osseous abnormality. IMPRESSION: No active cardiopulmonary disease. Electronically Signed   By: Margaretha Sheffield MD   On: 11/03/2020 11:19   CT Angio Chest PE W and/or Wo Contrast  Result Date: 11/03/2020 CLINICAL DATA:  Epigastric pain, transaminitis. Shortness of breath. EXAM: CT ANGIOGRAPHY CHEST WITH CONTRAST TECHNIQUE: Multidetector CT imaging of the chest was performed using the standard protocol during bolus administration of intravenous contrast. Multiplanar CT image reconstructions and MIPs were obtained to evaluate the vascular anatomy. CONTRAST:  168m OMNIPAQUE IOHEXOL 350 MG/ML SOLN COMPARISON:  None. FINDINGS: Cardiovascular: No filling defects in the pulmonary arteries to suggest pulmonary emboli. Heart is normal size. Calcifications throughout the left anterior descending coronary artery. Scattered aortic calcifications. No aneurysm. Mediastinum/Nodes: Small scattered and borderline sized mediastinal lymph nodes, likely reactive, none pathologically enlarged. Trachea and esophagus are unremarkable. Thyroid unremarkable. Lungs/Pleura: Linear dependent densities in the lower lobes compatible with atelectasis. No effusions. No confluent opacities or suspicious pulmonary nodules. Upper Abdomen: Gallbladder moderately distended. Musculoskeletal: Chest wall soft tissues are unremarkable. No acute bony  abnormality. Review of the MIP images confirms the above findings. IMPRESSION: No evidence of pulmonary embolus. Dependent atelectasis in the lower lobes. Coronary artery disease. Gallbladder distension.  See abdominal CT report today. Aortic Atherosclerosis (ICD10-I70.0). Electronically Signed   By: KRolm BaptiseM.D.   On: 11/03/2020 14:59   CT ABDOMEN PELVIS W CONTRAST  Result Date: 11/03/2020 CLINICAL DATA:  Epigastric pain, transaminitis.  Recent colonoscopy. EXAM: CT ABDOMEN AND PELVIS WITH CONTRAST TECHNIQUE: Multidetector CT imaging of the abdomen and pelvis was performed using the standard protocol following bolus administration of intravenous contrast. CONTRAST:  1062mOMNIPAQUE IOHEXOL 350 MG/ML SOLN COMPARISON:  04/07/2010 FINDINGS: Lower chest: Dependent atelectasis in the lung bases.  No effusions. Hepatobiliary: Gallbladder is distended. Possible slight surrounding stranding or fluid. No focal hepatic abnormality. No biliary ductal dilatation. Pancreas: No focal abnormality or ductal dilatation. Spleen: No focal abnormality.  Normal size. Adrenals/Urinary Tract: No adrenal abnormality. No focal renal abnormality. No stones or hydronephrosis. Urinary bladder is unremarkable. Stomach/Bowel: Postoperative changes in the sigmoid colon. Normal appendix. Stomach and small bowel decompressed, unremarkable. Vascular/Lymphatic: Aortic calcifications. No evidence of aneurysm or adenopathy. Reproductive: No visible focal abnormality. Other: No free fluid or free air. Small bilateral inguinal hernias and umbilical hernia containing fat. Musculoskeletal: No acute bony abnormality. IMPRESSION: Mild distention of the gallbladder with possible slight surrounding stranding or fluid. Consider further evaluation with right upper quadrant ultrasound. No free air following colonoscopy. Umbilical and bilateral inguinal hernias containing fat. Aortic atherosclerosis. Electronically Signed   By: KeRolm Baptise.D.   On:  11/03/2020 15:02   US[place]oreabdomen Limited  Result Date: 11/03/2020 CLINICAL DATA:  ruq pain, trasnaminitis, elevated bili EXAM: ULTRASOUND ABDOMEN LIMITED RIGHT UPPER QUADRANT COMPARISON:  04/07/2010 and prior. FINDINGS: Gallbladder: No gallstones or wall thickening visualized. No sonographic [place] sign noted by sonographer. Common bile duct: Diameter: 5.3 mm Liver: No focal lesion identified. Increased parenchymal echogenicity. Portal vein is patent on color Doppler imaging with normal direction of blood flow towards the liver. Other: None. IMPRESSION: Hepatic steatosis. Otherwise unremarkable right upper quadrant ultrasound. Electronically Signed   By: ChPrimitivo Gauze.D.   On: 11/03/2020 12:57      Assessment/Plan HTN HLD GERD Morbid obesity - BMI 41.05 Hx of partial colectomy for diverticulitis   RUQ/epigastric Abdominal pain  Elevated LFTs Possible choledocholithiasis  - US[place]orea2/9 with hepatic steatosis, no gallstones and no gallbladder wall thickening or  pericholecystic fluid; reviewed with Dr. Thermon Leyland who thinks gallbladder sludge may actually be present. - CT 12/9 with mild distention of gallbladder possible mild stranding - WBC 11.7 on admit, 9.4 today; afebrile, mildly tachycardic at times; continue IV abx - Tbili 9.5 on admit and up to 9.9 today; direct bili increasing and indirect trending down - AST/ALT and Alk phos all elevated on admit and again today - GI seeing as well and has ordered a MRCP, agree with this to r/o choledocholithiasis. Alternative would be lap chole with IOC, possible laparoscopic common duct exploration vs ERCP. Will discuss with my attending, Dr. Thermon Leyland.   FEN: continue NPO, IVF  ID: [place] VTE: SCD's, hold chemical VTE for possible procedure, will order lovenox for tonight if no procedure planned for today.  Foley: none   Obie Dredge, PA-C

## 2020-11-04 NOTE — Op Note (Signed)
Patient: Lawrence Wheeler (27-Apr-1946, [id number])  Date of Surgery: 11/03/2020 - 11/04/2020   Preoperative Diagnosis: Cholecystitis, umbilical hernia  Postoperative Diagnosis: Cholecystitis, fundic gland polyps, umbilical hernia  Surgical Procedure:  LAPAROSCOPIC CHOLECYSTECTOMY WITH INTRAOPERATIVE CHOLANGIOGRAM  UMBILICAL HERNIA REPAIR UPPER ENDOSCOPY  Operative Team Members:  Surgeon: Felicie Morn, MD - Primary  Assitant: Melina Modena, PA - Assistant  Anesthesiologist: Freddrick March, MD CRNA: [place] Stephanie C, CRNA; Eben Burow, CRNA   Anesthesia: General   Fluids:  Total I/O In: 0962 [I.V.:1215; IV Piggyback:100] Out: 360 [Urine:350; EZMOQ:94]  Complications: None  Drains:  (19 [place]) Jackson-Pratt drain(s) with closed bulb suction in the right upper quadrant laying in the gallbladder fossa   Specimen:  ID Type Source Tests Collected by Time Destination  1 : GALLBLADDER GI Gallbladder SURGICAL PATHOLOGY Konstantinos Cordoba, Nickola Major, MD 11/04/2020 1420      Disposition:  PACU - hemodynamically stable.  Plan of Care: continued care on floor on medical service.  NPO overnight and check WBC, LFTs, Bilirubin in the morning.      Indications for Procedure: Lawrence Wheeler is a 74 y.o. male who presented with severe right upper quadrant abdominal pain after a colonoscopy on Tuesday, 11/01/2020.  He was found to have direct hyperbilirubinemia and a dilated gallbladder on imaging.  There was concern for cholecystitis and possible choledocholithiasis as well as possible complication from colonoscopy so the recommendation was made to proceed with laparoscopic cholecystectomy with intraoperative cholangiogram and possible laparoscopic common bile duct exploration.  He was also noted to have an umbilical hernia which we recommended fixing at the time of surgery as well.  The procedure itself, as well as the risks, benefits and alternatives were discussed with the patient.   Risks discussed included but were not limited to the risk of infection, bleeding, damage to nearby structures, need to convert to open procedure, incisional hernia, bile leak, common bile duct injury and the need for additional procedures or surgeries.  With this discussion complete and all questions answered the patient granted consent to proceed.  Findings: Distended, inflamed gallbladder; reactive ascites; fundic gland polyps  Description of Procedure:   On the date stated above, the patient was taken to the operating room suite and placed in supine positioning.  Sequential compression devices were placed on the lower extremities to prevent blood clots.  General endotracheal anesthesia was induced. Preoperative antibiotics (meropenem) were given within 30 minutes of incision.  The patient's abdomen was prepped and draped in the usual sterile fashion.  A time-out was completed verifying the correct patient, procedure, positioning and equipment needed for the case.  We began by anesthetizing the skin with local anesthetic and then making a 12 mm incision just above the umbilicus over the umbilical hernia.  The contents of the hernia were easily reduced and 2 Vicryl sutures were placed on the fascial edges of the hernia.  The [facility] trocar was inserted in the abdomen and secured with the Vicryl sutures.  The abdomen was insufflated 15 mmHg.  On initial inspection there was no trauma to the underlying viscera with initial trocar placement.  The gallbladder appeared inflamed.  There was inflammatory ascites throughout the abdomen.  On diagnostic laparoscopy there was no abnormality noted on the visible portion of the colon however visibility was limited due to the patient's body habitus.  There were no ulcers visualized along the stomach or first portion of the duodenum.  I pressed down on this second portion of the  duodenum and had the anesthesiologist inflate the orogastric tube with 2 L of oxygen.  I  inspected and saw no bubbles coming from the stomach or first portion of the duodenum.  The OG tube was placed back to suction and the foregut was decompressed.  Other than the ascites in the inflamed gallbladder we found no abnormalities on inspection.  We proceed with laparoscopic cholecystectomy.  Three 5 mm trochars were placed along the rib right subcostal region.  These were all placed under direct vision without any trauma to the underlying viscera.  The patient was then placed in head up, left side down positioning.  The gallbladder was identified and dissected free from its attachments to the omentum allowing the duodenum to fall away.  The infundibulum of the gallbladder was dissected free working laterally to medially.  The cystic duct and cystic artery were dissected free from surrounding connective tissue.  The infundibulum of the gallbladder was dissected off the cystic plate.  A critical view of safety was obtained with the cystic duct and cystic artery being cleared of connective tissues and clearly the only two structures entering into the gallbladder with the liver clearly visible behind.  One clip was applied high on the cystic duct.  A small ductotomy was created below this using the endoscopic shears.  A cholangiogram catheter was introduced through the abdominal wall and into the cystic duct through this ductotomy.  The catheter was clipped into position.  The catheter was flushed to ensure no leakage around the clip.  We then removed the laparoscopic instruments and positioned the C-Arm to perform a cholangiogram.  The catheter was flushed with contrast under fluoroscopic visualization and a cholangiogram was obtained.  The cholangiogram visualized the biliary tree from the ampulla up to the first two biliary radicals in the liver.  There were no filling defects identified.  The catheter clearly entered the cystic duct.  There was gradual tapering of the common bile duct down to the ampulla  without evidence of stricture or other abnormalities.  Please see the EMR for saved representative images.  With our cholangiogram compete, we moved the c-arm away from the field and returned to laparoscopic surgery.    Clips were then applied to the cystic duct and cystic artery and then these structures were divided.  The gallbladder was dissected off the cystic plate, placed in an endocatch bag and removed from the 12 mm subxiphoid port site.  The clips were inspected and appeared effective.  The cystic plate was inspected and hemostasis was obtained using electrocautery.  A suction irrigator was used to clean the operative field.  Due to our diagnostic uncertainty we decided to place a 19 round channel drain in the gallbladder fossa.  This was brought out the right upper quadrant port site.  He was secured in position using a Vicryl suture to the skin.  A drainage bulb was attached for bulb suction.  With the cholecystectomy completed we directed our attention to the umbilical hernia.  The abdomen was desufflated and the trochars removed.  The fascial edges of the umbilical defect were cleared off using electrocautery and blunt dissection.  The stalk of the umbilicus was released from the linea alba using electrocautery, being careful not to injure the skin of the umbilicus.  We then closed the fascia of the umbilical hernia defect using two 2-0 PDS in a running fashion.  The umbilical stalk was tacked back down to the fascia using a 2-0 Vicryl suture.  The skin was closed using 4-0 Monocryl at the umbilicus as well as the other port sites.  Dermabond was applied.  All sponge needle counts were correct in this case.  With the laparoscopic portion of the case complete I began the upper endoscopy.  I scrubbed out and obtained the upper endoscope. I gently placed endoscope in the patient's oropharynx and gently glided it down the esophagus without any difficulty under direct visualization.  The scope was  advanced as far as the second portion of the duodenum and then slowly withdrawn to inspect the foregut anatomy.  There were no abnormalities noted within the visualized duodenum.  There were no abnormalities in the pylorus or distal stomach.  Multiple fundic gland polyps were noted in the upper abdomen.  There was a moderate sized sliding hiatal hernia.  The esophagus appeared normal.  The foregut was decompressed and the endoscope was removed.  The patient was awoken from anesthesia and transferred to the postanesthesia care unit in stable condition.   Louanna Raw, MD General, Bariatric, & Minimally Invasive Surgery [facility], [place]

## 2020-11-04 NOTE — Consult Note (Signed)
Referring Provider: [facility] Primary Care Physician:  Shirline Frees, MD Primary Gastroenterologist:  Dr. Paulita Fujita [facility])  Reason for Consultation: Transaminitis  HPI: Lawrence Wheeler is a 74 y.o. male with past medical history of GERD and colonic resection presenting for consultation of transaminitis and right upper quadrant abdominal pain.  Patient presented to the ED due to persistent right upper quadrant abdominal pain.  He states pain started on 12/8.  He had a colonoscopy on 12/7 which was completed without difficulty or complication.  Patient reports severe epigastric and right upper quadrant abdominal pain today.   Denies any prior episodes of pain similar to this.  He also has associated nausea but denies any vomiting.  Denies any changes in stool.  Denies melena or hematochezia.  11/01/20 colonoscopy: diverticulosis, normal sigmoid anastamosis, 76mm polyp in sigmoid.  Past Medical History:  Diagnosis Date  . Cataract    mild  . Colon polyp   . Diverticulitis   . GERD (gastroesophageal reflux disease)   . Hearing loss    bilateral- hearing aids  . Hypertension   . Morbid obesity ([place])   . OA (osteoarthritis)    knees    Past Surgical History:  Procedure Laterality Date  . BACK SURGERY    . COLONOSCOPY W/ POLYPECTOMY    . COLONOSCOPY WITH PROPOFOL N/A 01/23/2016   Procedure: COLONOSCOPY WITH PROPOFOL;  Surgeon: Laurence Spates, MD;  Location: [facility] ENDOSCOPY;  Service: Endoscopy;  Laterality: N/A;  . HOT HEMOSTASIS N/A 01/23/2016   Procedure: HOT HEMOSTASIS (ARGON PLASMA COAGULATION/BICAP);  Surgeon: Laurence Spates, MD;  Location: [facility];  Service: Endoscopy;  Laterality: N/A;  . JOINT REPLACEMENT Right    RTKA 1'16  . KNEE ARTHROSCOPY     left   . PARTIAL COLECTOMY     "diverticulitis"  . TOTAL KNEE ARTHROPLASTY Right 12/20/2014   Procedure: RIGHT TOTAL KNEE ARTHROPLASTY;  Surgeon: [name], MD;  Location: [facility] ORS;  Service: Orthopedics;  Laterality: Right;     Prior to Admission medications   Medication Sig Start Date End Date Taking? Authorizing Provider  amLODipine (NORVASC) 5 MG tablet Take 1 tablet (5 mg total) by mouth daily. Patient taking differently: Take 5 mg by mouth every evening. 08/30/15  Yes Belva Crome, MD  atorvastatin (LIPITOR) 20 MG tablet Take 20 mg by mouth every evening.    Yes [provider]  ibuprofen (ADVIL) 200 MG tablet Take 800 mg by mouth 2 (two) times daily as needed (ARTHRITIS PAIN).   Yes [provider]  pantoprazole (PROTONIX) 40 MG tablet Take 40 mg by mouth every morning.    Yes [provider]    Scheduled Meds: Continuous Infusions: . dextrose 5 % and 0.9% NaCl 125 mL/hr at 11/04/20 0651  . meropenem (MERREM) IV 1 g (11/04/20 0709)   PRN Meds:.hydrALAZINE, HYDROmorphone (DILAUDID) injection  Allergies as of 11/03/2020 - Review Complete 11/03/2020  Allergen Reaction Noted  . Flagyl [metronidazole] Nausea Only 12/13/2014  . Penicillins Other (See Comments) 03/23/2010  . Doxycycline Nausea Only 12/13/2014  . Prednisone Nausea Only 12/13/2014  . Sulfa antibiotics Nausea Only 12/13/2014  . Ciprofloxacin Nausea And Vomiting 12/13/2014    Family History  Problem Relation Age of Onset  . Osteoarthritis Mother   . Cancer Mother   . Osteoarthritis Sister   . Osteoarthritis Brother   . Heart disease Brother   . Diabetes Paternal Grandfather   . Healthy Child     Social History   Socioeconomic History  .  Marital status: Married    Spouse name: Not on file  . Number of children: Not on file  . Years of education: Not on file  . Highest education level: Not on file  Occupational History  . Not on file  Tobacco Use  . Smoking status: Never Smoker  . Smokeless tobacco: Never Used  Substance and Sexual Activity  . Alcohol use: Yes    Comment: occasional   . Drug use: No  . Sexual activity: Not on file  Other Topics Concern  . Not on file  Social History  Narrative  . Not on file   Social Determinants of Health   Financial Resource Strain: Not on file  Food Insecurity: Not on file  Transportation Needs: Not on file  Physical Activity: Not on file  Stress: Not on file  Social Connections: Not on file  Intimate Partner Violence: Not on file    Review of Systems: Review of Systems  Constitutional: Negative for chills and fever.  HENT: Negative for hearing loss and tinnitus.   Eyes: Negative for pain and redness.  Respiratory: Negative for cough and shortness of breath.   Cardiovascular: Negative for chest pain and palpitations.  Gastrointestinal: Positive for abdominal pain and nausea. Negative for blood in stool, constipation, diarrhea, heartburn, melena and vomiting.  Genitourinary: Negative for flank pain and hematuria.  Musculoskeletal: Negative for falls and joint pain.  Skin: Negative for itching and rash.       +jaundice  Neurological: Negative for seizures and loss of consciousness.  Endo/Heme/Allergies: Negative for polydipsia. Does not bruise/bleed easily.  Psychiatric/Behavioral: Negative for substance abuse. The patient is not nervous/anxious.      Physical Exam: Vital signs: Vitals:   11/04/20 0359 11/04/20 0400  BP: (!) 154/89 (!) 154/89  Pulse: 75 75  Resp: 16 16  Temp: 98.3 F (36.8 C) 98.3 F (36.8 C)  SpO2:  93%   Last BM Date: 11/01/20 (colonoscopy) Physical Exam Vitals reviewed.  Constitutional:      General: He is in acute distress (in pain).     Appearance: He is obese. He is ill-appearing.  HENT:     Head: Normocephalic and atraumatic.     Nose: Nose normal. No congestion.     Mouth/Throat:     Mouth: Mucous membranes are moist.     Pharynx: Oropharynx is clear.  Eyes:     General: Scleral icterus present.     Extraocular Movements: Extraocular movements intact.  Cardiovascular:     Rate and Rhythm: Normal rate and regular rhythm.     Pulses: Normal pulses.  Pulmonary:     Effort:  Pulmonary effort is normal. No respiratory distress.     Breath sounds: Normal breath sounds.  Abdominal:     General: Bowel sounds are normal. There is no distension.     Palpations: Abdomen is soft. There is no mass.     Tenderness: There is abdominal tenderness (moderate RUQ, epigastrium). There is guarding. There is no rebound.     Hernia: No hernia is present.  Musculoskeletal:        General: No swelling or tenderness.     Cervical back: Normal range of motion and neck supple.  Skin:    General: Skin is warm and dry.     Coloration: Skin is jaundiced.  Neurological:     General: No focal deficit present.     Mental Status: He is oriented to person, place, and time. He is lethargic.  Psychiatric:  Mood and Affect: Mood normal.        Behavior: Behavior normal. Behavior is cooperative.      GI:  Lab Results: Recent Labs    11/03/20 1050 11/04/20 0645  WBC 11.7* 9.4  HGB 14.8 14.0  HCT 41.7 41.9  PLT 168 152   BMET Recent Labs    11/03/20 1050 11/04/20 0645  NA 134* 137  K 3.7 3.5  CL 101 104  CO2 24 24  GLUCOSE 143* 138*  BUN 14 13  CREATININE 0.99 0.85  CALCIUM 8.6* 8.3*   LFT Recent Labs    11/04/20 0645  PROT 7.1  ALBUMIN 3.4*  AST 142*  ALT 227*  ALKPHOS 124  BILITOT 9.9*  BILIDIR 6.3*  IBILI 3.6*   PT/INR Recent Labs    11/03/20 1134  LABPROT 15.0  INR 1.2     Studies/Results: DG Chest 2 View  Result Date: 11/03/2020 CLINICAL DATA:  Chest/epigastric pain. EXAM: CHEST - 2 VIEW COMPARISON:  March 04, 2010. FINDINGS: Borderline enlarged cardiac silhouette. Tortuous aorta. Low lung volumes. Both lungs are clear. No visible pleural effusions or pneumothorax. No acute osseous abnormality. IMPRESSION: No active cardiopulmonary disease. Electronically Signed   By: Margaretha Sheffield MD   On: 11/03/2020 11:19   CT Angio Chest PE W and/or Wo Contrast  Result Date: 11/03/2020 CLINICAL DATA:  Epigastric pain, transaminitis. Shortness of  breath. EXAM: CT ANGIOGRAPHY CHEST WITH CONTRAST TECHNIQUE: Multidetector CT imaging of the chest was performed using the standard protocol during bolus administration of intravenous contrast. Multiplanar CT image reconstructions and MIPs were obtained to evaluate the vascular anatomy. CONTRAST:  157mL OMNIPAQUE IOHEXOL 350 MG/ML SOLN COMPARISON:  None. FINDINGS: Cardiovascular: No filling defects in the pulmonary arteries to suggest pulmonary emboli. Heart is normal size. Calcifications throughout the left anterior descending coronary artery. Scattered aortic calcifications. No aneurysm. Mediastinum/Nodes: Small scattered and borderline sized mediastinal lymph nodes, likely reactive, none pathologically enlarged. Trachea and esophagus are unremarkable. Thyroid unremarkable. Lungs/Pleura: Linear dependent densities in the lower lobes compatible with atelectasis. No effusions. No confluent opacities or suspicious pulmonary nodules. Upper Abdomen: Gallbladder moderately distended. Musculoskeletal: Chest wall soft tissues are unremarkable. No acute bony abnormality. Review of the MIP images confirms the above findings. IMPRESSION: No evidence of pulmonary embolus. Dependent atelectasis in the lower lobes. Coronary artery disease. Gallbladder distension.  See abdominal CT report today. Aortic Atherosclerosis (ICD10-I70.0). Electronically Signed   By: Rolm Baptise M.D.   On: 11/03/2020 14:59   CT ABDOMEN PELVIS W CONTRAST  Result Date: 11/03/2020 CLINICAL DATA:  Epigastric pain, transaminitis.  Recent colonoscopy. EXAM: CT ABDOMEN AND PELVIS WITH CONTRAST TECHNIQUE: Multidetector CT imaging of the abdomen and pelvis was performed using the standard protocol following bolus administration of intravenous contrast. CONTRAST:  147mL OMNIPAQUE IOHEXOL 350 MG/ML SOLN COMPARISON:  04/07/2010 FINDINGS: Lower chest: Dependent atelectasis in the lung bases.  No effusions. Hepatobiliary: Gallbladder is distended. Possible  slight surrounding stranding or fluid. No focal hepatic abnormality. No biliary ductal dilatation. Pancreas: No focal abnormality or ductal dilatation. Spleen: No focal abnormality.  Normal size. Adrenals/Urinary Tract: No adrenal abnormality. No focal renal abnormality. No stones or hydronephrosis. Urinary bladder is unremarkable. Stomach/Bowel: Postoperative changes in the sigmoid colon. Normal appendix. Stomach and small bowel decompressed, unremarkable. Vascular/Lymphatic: Aortic calcifications. No evidence of aneurysm or adenopathy. Reproductive: No visible focal abnormality. Other: No free fluid or free air. Small bilateral inguinal hernias and umbilical hernia containing fat. Musculoskeletal: No acute bony abnormality. IMPRESSION: Mild distention  of the gallbladder with possible slight surrounding stranding or fluid. Consider further evaluation with right upper quadrant ultrasound. No free air following colonoscopy. Umbilical and bilateral inguinal hernias containing fat. Aortic atherosclerosis. Electronically Signed   By: Rolm Baptise M.D.   On: 11/03/2020 15:02   US Abdomen Limited  Result Date: 11/03/2020 CLINICAL DATA:  ruq pain, trasnaminitis, elevated bili EXAM: ULTRASOUND ABDOMEN LIMITED RIGHT UPPER QUADRANT COMPARISON:  04/07/2010 and prior. FINDINGS: Gallbladder: No gallstones or wall thickening visualized. No sonographic [place] sign noted by sonographer. Common bile duct: Diameter: 5.3 mm Liver: No focal lesion identified. Increased parenchymal echogenicity. Portal vein is patent on color Doppler imaging with normal direction of blood flow towards the liver. Other: None. IMPRESSION: Hepatic steatosis. Otherwise unremarkable right upper quadrant ultrasound. Electronically Signed   By: Primitivo Gauze M.D.   On: 11/03/2020 12:57    Impression: Transaminitis and right upper quadrant abdominal pain.  Most concerning for cholecystitis.  No CBD dilation seen on CT or ultrasound.  CT revealed  distended gallbladder with possible slight surrounding stranding or fluid but [place] was unremarkable. -T bili 9.9/AST 142/ALT 227/ALP 124 -Normal WBCs (9.4), improved from 11.7 yesterday  Plan: Discussed with surgical PA.  Plan for laparoscopic cholecystectomy today with IOC.  If IOC is positive, recommend proceeding with ERCP.   Continue to trend LFTs.  [facility] will follow.   LOS: 0 days   Salley Slaughter  PA-C 11/04/2020, 8:39 AM  Contact #  [phone]

## 2020-11-04 NOTE — Plan of Care (Signed)
  Problem: Education: Goal: Knowledge of General Education information will improve Description: Patient will be able to correctly use pain scale and understand the side effects of analgesics. Patient will be taught and encouraged to use non-pharmacologic comfort measures 11/04/2020 0137 by Kurtis Bushman, RN Outcome: Progressing 11/04/2020 0126 by Kurtis Bushman, RN Outcome: Progressing   Problem: Health Behavior/Discharge Planning: Goal: Ability to manage health-related needs will improve Description: Improvement in the management of health-related needs based on information provided 11/04/2020 0137 by Kurtis Bushman, RN Outcome: Progressing 11/04/2020 0126 by Kurtis Bushman, RN Outcome: Progressing   Problem: Clinical Measurements: Goal: Ability to maintain clinical measurements within normal limits will improve 11/04/2020 0137 by Kurtis Bushman, RN Outcome: Progressing 11/04/2020 0126 by Kurtis Bushman, RN Outcome: Progressing Goal: Will remain free from infection 11/04/2020 0137 by Kurtis Bushman, RN Outcome: Progressing 11/04/2020 0126 by Kurtis Bushman, RN Outcome: Progressing Goal: Diagnostic test results will improve Outcome: Progressing Goal: Cardiovascular complication will be avoided 11/04/2020 0137 by Kurtis Bushman, RN Outcome: Progressing 11/04/2020 0126 by Kurtis Bushman, RN Outcome: Progressing   Problem: Nutrition: Goal: Adequate nutrition will be maintained 11/04/2020 0137 by Kurtis Bushman, RN Outcome: Progressing 11/04/2020 0126 by Kurtis Bushman, RN Outcome: Progressing   Problem: Elimination: Goal: Will not experience complications related to bowel motility 11/04/2020 0137 by Kurtis Bushman, RN Outcome: Progressing 11/04/2020 0126 by Kurtis Bushman, RN Outcome: Progressing   Problem: Pain Managment: Goal: General experience of comfort will improve 11/04/2020  0137 by Kurtis Bushman, RN Outcome: Progressing 11/04/2020 0126 by Kurtis Bushman, RN Outcome: Progressing   Problem: Safety: Goal: Ability to remain free from injury will improve 11/04/2020 0137 by Kurtis Bushman, RN Outcome: Progressing 11/04/2020 0126 by Kurtis Bushman, RN Outcome: Progressing

## 2020-11-04 NOTE — Anesthesia Preprocedure Evaluation (Addendum)
Anesthesia Evaluation  Patient identified by MRN, date of birth, ID band Patient awake    Reviewed: Allergy & Precautions, NPO status , Patient's Chart, lab work & pertinent test results  Airway Mallampati: III  TM Distance: >3 FB Neck ROM: Full  Mouth opening: Limited Mouth Opening  Dental  (+) Missing, Dental Advisory Given,    Pulmonary neg pulmonary ROS,    Pulmonary exam normal breath sounds clear to auscultation       Cardiovascular hypertension, Pt. on medications Normal cardiovascular exam Rhythm:Regular Rate:Normal  HLD  Stress Test 2016 Clinically negative for chest pain. Test was stopped due to fatigue/knee pain.  EKG negative for ischemia. No significant arrhythmia noted.     Neuro/Psych negative neurological ROS  negative psych ROS   GI/Hepatic Neg liver ROS, GERD  Medicated and Controlled,  Endo/Other  Morbid obesity (BMI 41)  Renal/GU negative Renal ROS  negative genitourinary   Musculoskeletal  (+) Arthritis ,   Abdominal   Peds  Hematology negative hematology ROS (+)   Anesthesia Other Findings gallstones  Reproductive/Obstetrics                            Anesthesia Physical Anesthesia Plan  ASA: III  Anesthesia Plan: General   Post-op Pain Management:    Induction: Intravenous  PONV Risk Score and Plan: 2 and Midazolam, Dexamethasone and Ondansetron  Airway Management Planned: Oral ETT  Additional Equipment:   Intra-op Plan:   Post-operative Plan: Extubation in OR  Informed Consent: I have reviewed the patients History and Physical, chart, labs and discussed the procedure including the risks, benefits and alternatives for the proposed anesthesia with the patient or authorized representative who has indicated his/her understanding and acceptance.     Dental advisory given  Plan Discussed with: CRNA  Anesthesia Plan Comments:          Anesthesia Quick Evaluation

## 2020-11-04 NOTE — Transfer of Care (Signed)
Immediate Anesthesia Transfer of Care Note  Patient: Lawrence Wheeler  Procedure(s) Performed: LAPAROSCOPIC CHOLECYSTECTOMY WITH INTRAOPERATIVE CHOLANGIOGRAM AND UMBILICAL HERNIA REPAIR, UPPER ENDOSCOPY (N/A Abdomen)  Patient Location: PACU  Anesthesia Type:General  Level of Consciousness: awake, alert  and patient cooperative  Airway & Oxygen Therapy: Patient Spontanous Breathing and Patient connected to face mask oxygen  Post-op Assessment: Report given to RN and Post -op Vital signs reviewed and stable  Post vital signs: Reviewed and stable  Last Vitals:  Vitals Value Taken Time  BP 138/71 11/04/20 1545  Temp    Pulse 86 11/04/20 1546  Resp 14 11/04/20 1546  SpO2 97 % 11/04/20 1546  Vitals shown include unvalidated device data.  Last Pain:  Vitals:   11/04/20 1307  TempSrc: Oral  PainSc:       Patients Stated Pain Goal: 2 (75/30/10 4045)  Complications: No complications documented.

## 2020-11-04 NOTE — Assessment & Plan Note (Addendum)
-   no obvious pathology seen in OR on exam - having flatus, no BM yet - ambulating well - bowel regimen recommended at discharge but eating well and no N/V

## 2020-11-04 NOTE — Assessment & Plan Note (Signed)
-  Weight loss and lifestyle modification will be recommended prior to discharge

## 2020-11-04 NOTE — Anesthesia Procedure Notes (Signed)
Procedure Name: Intubation Date/Time: 11/04/2020 2:04 PM Performed by: [place] Legend Pecore C, CRNA Pre-anesthesia Checklist: Patient identified, Emergency Drugs available, Suction available and Patient being monitored Patient Re-evaluated:Patient Re-evaluated prior to induction Oxygen Delivery Method: Circle system utilized Preoxygenation: Pre-oxygenation with 100% oxygen Induction Type: IV induction Ventilation: Mask ventilation without difficulty Laryngoscope Size: Mac and 4 Grade View: Grade II Tube type: Oral Tube size: 7.5 mm Number of attempts: 1 Airway Equipment and Method: Stylet and Oral airway Placement Confirmation: ETT inserted through vocal cords under direct vision,  positive ETCO2 and breath sounds checked- equal and bilateral Secured at: 22 cm Tube secured with: Tape Dental Injury: Teeth and Oropharynx as per pre-operative assessment

## 2020-11-04 NOTE — Hospital Course (Addendum)
Mr. Fusselman is a 74 yo male with PMH obesity, HTN, GERD, OA who presented to [facility] after no improvement with epigastric pain that started Tuesday night following CLN that same day.  He has had worsening pain, subjective fevers/chills, SOB 2/2 pain, and ongoing nausea. No diarrhea.  He also reports no bowel movement since Tuesday nor flatus.  In the ER he was found to have elevated LFTs and TB.  CT abd/pelvis showed a distended GB with essentially pericholecystic fluid. A HIDA was ordered and patient was admitted and started on meropenem.  The morning after admission, surgery was consulted. HIDA was canceled and instead patient was felt more appropriate for undergoing CCY given his clinical presentation.  He tolerated surgery well and was able to be resumed on a diet the following morning and PT was also consulted.  He continued to clinically improve and diet was advanced further. He ambulated well with no difficulty in the hallway. His drain was removed prior to discharge and he did not require any further antibiotics at discharge as well. He was instructed to follow-up outpatient with his primary care provider for repeat LFT check and follow-up with surgery as well.

## 2020-11-04 NOTE — Assessment & Plan Note (Addendum)
-   resume home amlodipine - continue PRN meds as well

## 2020-11-04 NOTE — Assessment & Plan Note (Addendum)
-   see acute cholecystitis - greatly appreciate GI and surgical involvement

## 2020-11-05 LAB — COMPREHENSIVE METABOLIC PANEL
ALT: 205 U/L — ABNORMAL HIGH (ref 0–44)
AST: 120 U/L — ABNORMAL HIGH (ref 15–41)
Albumin: 3 g/dL — ABNORMAL LOW (ref 3.5–5.0)
Alkaline Phosphatase: 130 U/L — ABNORMAL HIGH (ref 38–126)
Anion gap: 9 (ref 5–15)
BUN: 15 mg/dL (ref 8–23)
CO2: 25 mmol/L (ref 22–32)
Calcium: 8.2 mg/dL — ABNORMAL LOW (ref 8.9–10.3)
Chloride: 105 mmol/L (ref 98–111)
Creatinine, Ser: 1.02 mg/dL (ref 0.61–1.24)
GFR, Estimated: >60 mL/min (ref >60)
Glucose, Bld: 152 mg/dL — ABNORMAL HIGH (ref 70–99)
Potassium: 4 mmol/L (ref 3.5–5.1)
Sodium: 139 mmol/L (ref 135–145)
Total Bilirubin: 4.7 mg/dL — ABNORMAL HIGH (ref 0.3–1.2)
Total Protein: 6.6 g/dL (ref 6.5–8.1)

## 2020-11-05 LAB — CBC WITH DIFFERENTIAL/PLATELET
Abs Immature Granulocytes: 0.05 10*3/uL (ref 0.00–0.07)
Basophils Absolute: 0 10*3/uL (ref 0.0–0.1)
Basophils Relative: 0 %
Eosinophils Absolute: 0 10*3/uL (ref 0.0–0.5)
Eosinophils Relative: 0 %
HCT: 39.4 % (ref 39.0–52.0)
Hemoglobin: 13.2 g/dL (ref 13.0–17.0)
Immature Granulocytes: 0 %
Lymphocytes Relative: 7 %
Lymphs Abs: 0.9 10*3/uL (ref 0.7–4.0)
MCH: 33.7 pg (ref 26.0–34.0)
MCHC: 33.5 g/dL (ref 30.0–36.0)
MCV: 100.5 fL — ABNORMAL HIGH (ref 80.0–100.0)
Monocytes Absolute: 0.5 10*3/uL (ref 0.1–1.0)
Monocytes Relative: 4 %
Neutro Abs: 10.7 10*3/uL — ABNORMAL HIGH (ref 1.7–7.7)
Neutrophils Relative %: 89 %
Platelets: 161 10*3/uL (ref 150–400)
RBC: 3.92 MIL/uL — ABNORMAL LOW (ref 4.22–5.81)
RDW: 13.6 % (ref 11.5–15.5)
WBC: 12.1 10*3/uL — ABNORMAL HIGH (ref 4.0–10.5)
nRBC: 0 % (ref 0.0–0.2)

## 2020-11-05 LAB — MAGNESIUM: Magnesium: 2.4 mg/dL (ref 1.7–2.4)

## 2020-11-05 LAB — GLUCOSE, CAPILLARY
Glucose-Capillary: 143 mg/dL — ABNORMAL HIGH (ref 70–99)
Glucose-Capillary: 128 mg/dL — ABNORMAL HIGH (ref 70–99)
Glucose-Capillary: 178 mg/dL — ABNORMAL HIGH (ref 70–99)

## 2020-11-05 LAB — PHOSPHORUS: Phosphorus: 2.3 mg/dL — ABNORMAL LOW (ref 2.5–4.6)

## 2020-11-05 MED ORDER — LABETALOL HCL 5 MG/ML IV SOLN
10.0000 mg | INTRAVENOUS | Status: DC | PRN
  Filled 2020-11-05: qty 4

## 2020-11-05 MED ORDER — AMLODIPINE BESYLATE 5 MG PO TABS
5.0000 mg | ORAL_TABLET | Freq: Every evening | ORAL | Status: DC
  Administered 2020-11-05 (×2): 5 mg via ORAL
  Filled 2020-11-05 (×2): qty 1

## 2020-11-05 MED ORDER — HYDRALAZINE HCL 25 MG PO TABS: 25.0000 mg | ORAL_TABLET | ORAL | Status: DC | PRN

## 2020-11-05 MED ORDER — K PHOS MONO-SOD PHOS DI & MONO 155-852-130 MG PO TABS
500.0000 mg | ORAL_TABLET | Freq: Once | ORAL | Status: AC
  Administered 2020-11-05: 13:00:00 500 mg via ORAL
  Filled 2020-11-05: qty 2

## 2020-11-05 NOTE — Progress Notes (Signed)
[facility] Gastroenterology Progress Note  Lawrence Wheeler 74 y.o. 18-Nov-1946  CC: Abnormal LFTs, acute cholecystitis   Subjective: Patient seen and examined at bedside.  Family at bedside.  Feeling much better after cholecystectomy yesterday.  ROS : Negative for chest pain and shortness of breath   Objective: Vital signs in last 24 hours: Vitals:   11/04/20 2054 11/05/20 0613  BP: 114/62 124/79  Pulse: 80 71  Resp: 17 16  Temp: 98.7 F (37.1 C) 97.8 F (36.6 C)  SpO2: 92% 92%    Physical Exam:  General:   Resting comfortably.  Not in acute distress  Head:  Normocephalic, without obvious abnormality, atraumatic  Eyes:  , EOM's intact,   Lungs:    No visible respiratory distress noted  Heart:  Regular rate and rhythm, S1, S2 normal  Abdomen:   Soft, mild right upper quadrant discomfort on palpation, bowel sounds present, no peritoneal signs  Extremities: Extremities normal, atraumatic, no  edema       Lab Results: Recent Labs    11/04/20 0645 11/05/20 0650  NA 137 139  K 3.5 4.0  CL 104 105  CO2 24 25  GLUCOSE 138* 152*  BUN 13 15  CREATININE 0.85 1.02  CALCIUM 8.3* 8.2*  MG  --  2.4  PHOS  --  2.3*   Recent Labs    11/04/20 0645 11/05/20 0650  AST 142* 120*  ALT 227* 205*  ALKPHOS 124 130*  BILITOT 9.9* 4.7*  PROT 7.1 6.6  ALBUMIN 3.4* 3.0*   Recent Labs    11/04/20 0645 11/05/20 0650  WBC 9.4 12.1*  NEUTROABS  --  10.7*  HGB 14.0 13.2  HCT 41.9 39.4  MCV 101.5* 100.5*  PLT 152 161   Recent Labs    11/03/20 1134  LABPROT 15.0  INR 1.2      Assessment/Plan: -Abnormal LFTs with right upper quadrant abdominal pain.  S/p lap chole yesterday.  Negative IOC.  LFTs improving.  Recommendations ------------------------ -Patient is feeling better.  LFTs are improving.  No further inpatient GI work-up planned.  GI will sign off.  Call us back if needed.   Otis Brace MD, [place] 11/05/2020, 11:06 AM  Contact #  [phone]

## 2020-11-05 NOTE — Evaluation (Signed)
Physical Therapy Evaluation Patient Details Name: Lawrence Wheeler MRN: [id number] DOB: [birth year] Today's Date: 11/05/2020   History of Present Illness  Pt is 74 yo male with PMH of HTN, obesity, GERD, OA, R TKA, and back surgery.  Pt admitted with epigastric pain due to acute cholecystitis and is s/p lap chole ono 11/04/20.  Clinical Impression  Pt admitted with above diagnosis.  He was able to demonstrate bed mobility with min A (hand to pull up on) and gait with supervision.  Expected to progress well. Pt lives with wife and is normally very independent.  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  Pt does not require skilled therapy while in hospital. Recommend ambulation with nursing or family.     Follow Up Recommendations No PT follow up;Supervision for mobility/OOB;Other (comment) (Continue his outpt PT for chronic condition once cleared by surgeon)    Equipment Recommendations  None recommended by PT    Recommendations for Other Services       Precautions / Restrictions Precautions Precautions: None      Mobility  Bed Mobility Overal bed mobility: Needs Assistance Bed Mobility: Supine to Sit     Supine to sit: Min assist     General bed mobility comments: Pt needed a hand to pull up on but otherwise able to perform on his own    Transfers Overall transfer level: Needs assistance   Transfers: Sit to/from Stand Sit to Stand: Supervision         General transfer comment: supervision for safety  Ambulation/Gait Ambulation/Gait assistance: Supervision;Min guard Gait Distance (Feet): 200 Feet Assistive device: None (handrail 50% of time otherwise no AD) Gait Pattern/deviations: Wide base of support;Decreased weight shift to left Gait velocity: decreased   General Gait Details: minguard progressed to supervision; mild antalgic pattern due to chronic L knee OA but otherwise stable gait  Stairs Stairs:  (Discussed  stair technique and pt confident in his ability; demonstrated mobility necessary for stairs.)          Wheelchair Mobility    Modified Rankin (Stroke Patients Only)       Balance Overall balance assessment: Needs assistance;Modified Independent   Sitting balance-Leahy Scale: Normal     Standing balance support: No upper extremity supported Standing balance-Leahy Scale: Good                               Pertinent Vitals/Pain Pain Assessment: 0-10 Pain Score: 2  Pain Location: abdomen Pain Descriptors / Indicators: Sore Pain Intervention(s): Limited activity within patient's tolerance;Monitored during session    Home Living Family/patient expects to be discharged to:: Private residence Living Arrangements: Spouse/significant other Available Help at Discharge: Family;Available 24 hours/day Type of Home: House Home Access: Stairs to enter Entrance Stairs-Rails: None Entrance Stairs-Number of Steps: 3 Home Layout: Two level;Bed/bath upstairs;Other (Comment);Full bath on main level (could stay on first floor if necessary) Home Equipment: Grab bars - tub/shower;Shower seat;Walker - 2 wheels;Cane - single point      Prior Function Level of Independence: Independent         Comments: Does wear a brace on L knee - reports OA and may need TKA in future     Hand Dominance        Extremity/Trunk Assessment   Upper Extremity Assessment Upper Extremity Assessment: Overall WFL for tasks assessed    Lower Extremity Assessment Lower Extremity  Assessment: LLE deficits/detail LLE Deficits / Details: Reports OA and plan for TKA in future, but was functional    Cervical / Trunk Assessment Cervical / Trunk Assessment: Normal  Communication   Communication: No difficulties  Cognition Arousal/Alertness: Awake/alert Behavior During Therapy: WFL for tasks assessed/performed Overall Cognitive Status: Within Functional Limits for tasks assessed                                         General Comments General comments (skin integrity, edema, etc.): VSS on RA; Discussed PT role and POC.  Recommended ambulation 2-3 x day with nursing staff and could ambulate in room with family.  Pt was receiving outpt PT for back pain and L knee OA - discussed return once cleared by surgery    Exercises     Assessment/Plan    PT Assessment Patent does not need any further PT services  PT Problem List         PT Treatment Interventions      PT Goals (Current goals can be found in the Care Plan section)  Acute Rehab PT Goals Patient Stated Goal: return home PT Goal Formulation: All assessment and education complete, DC therapy    Frequency     Barriers to discharge        Co-evaluation               AM-PAC PT "6 Clicks" Mobility  Outcome Measure Help needed turning from your back to your side while in a flat bed without using bedrails?: A Little Help needed moving from lying on your back to sitting on the side of a flat bed without using bedrails?: A Little Help needed moving to and from a bed to a chair (including a wheelchair)?: A Little Help needed standing up from a chair using your arms (e.g., wheelchair or bedside chair)?: A Little Help needed to walk in hospital room?: A Little Help needed climbing 3-5 steps with a railing? : A Little 6 Click Score: 18    End of Session Equipment Utilized During Treatment: Gait belt Activity Tolerance: Patient tolerated treatment well Patient left: in chair;with call bell/phone within reach;with family/visitor present        Time: 1349-1418 PT Time Calculation (min) (ACUTE ONLY): 29 min   Charges:   PT Evaluation $PT Eval Low Complexity: 1 Low PT Treatments $Gait Training: 8-22 mins        Abran Richard, PT Acute Rehab Services Pager [phone] [facility] [place] 11/05/2020, 2:33 PM

## 2020-11-05 NOTE — Progress Notes (Signed)
1 Day Post-Op   Subjective/Chief Complaint: Doing well, feels better, no emesis, not up yet, voiding   Objective: Vital signs in last 24 hours: Temp:  [97.8 F (36.6 C)-101.6 F (38.7 C)] 97.8 F (36.6 C) (12/11 0613) Pulse Rate:  [71-103] 71 (12/11 0613) Resp:  [13-17] 16 (12/11 0613) BP: (114-186)/(60-90) 124/79 (12/11 0613) SpO2:  [92 %-98 %] 92 % (12/11 9518) Weight:  [122.5 kg] 122.5 kg (12/10 1318) Last BM Date: 11/01/20  Intake/Output from previous day: 12/10 0701 - 12/11 0700 In: 1515 [I.V.:1415; IV Piggyback:100] Out: 1220 [Urine:1050; Drains:160; Blood:10] Intake/Output this shift: No intake/output data recorded.  GI: soft nondistended incisoins clean drain serosang  Lab Results:  Recent Labs    11/04/20 0645 11/05/20 0650  WBC 9.4 12.1*  HGB 14.0 13.2  HCT 41.9 39.4  PLT 152 161   BMET Recent Labs    11/03/20 1050 11/04/20 0645  NA 134* 137  K 3.7 3.5  CL 101 104  CO2 24 24  GLUCOSE 143* 138*  BUN 14 13  CREATININE 0.99 0.85  CALCIUM 8.6* 8.3*   PT/INR Recent Labs    11/03/20 1134  LABPROT 15.0  INR 1.2   ABG No results for input(s): PHART, HCO3 in the last 72 hours.  Invalid input(s): PCO2, PO2  Studies/Results: DG Chest 2 View  Result Date: 11/03/2020 CLINICAL DATA:  Chest/epigastric pain. EXAM: CHEST - 2 VIEW COMPARISON:  March 04, 2010. FINDINGS: Borderline enlarged cardiac silhouette. Tortuous aorta. Low lung volumes. Both lungs are clear. No visible pleural effusions or pneumothorax. No acute osseous abnormality. IMPRESSION: No active cardiopulmonary disease. Electronically Signed   By: Margaretha Sheffield MD   On: 11/03/2020 11:19   DG Cholangiogram Operative  Result Date: 11/04/2020 CLINICAL DATA:  Laparoscopic cholecystectomy with intraoperative cholangiogram. EXAM: INTRAOPERATIVE CHOLANGIOGRAM TECHNIQUE: Cholangiographic images from the C-arm fluoroscopic device were submitted for interpretation post-operatively. Please see the  procedural report for the amount of contrast and the fluoroscopy time utilized. COMPARISON:  CT abdomen/pelvis 11/03/2020 FINDINGS: Saved cine loops submitted for review. The images demonstrate cannulation of the cystic duct remanent with intraoperative cholangiogram. No evidence of stenosis, stricture, or choledocholithiasis. Contrast material passes freely through the ampulla and into the duodenum. Slight reflux into the main pancreatic duct. IMPRESSION: Negative intraoperative cholangiogram. Electronically Signed   By: Jacqulynn Cadet M.D.   On: 11/04/2020 15:06   CT Angio Chest PE W and/or Wo Contrast  Result Date: 11/03/2020 CLINICAL DATA:  Epigastric pain, transaminitis. Shortness of breath. EXAM: CT ANGIOGRAPHY CHEST WITH CONTRAST TECHNIQUE: Multidetector CT imaging of the chest was performed using the standard protocol during bolus administration of intravenous contrast. Multiplanar CT image reconstructions and MIPs were obtained to evaluate the vascular anatomy. CONTRAST:  133mL OMNIPAQUE IOHEXOL 350 MG/ML SOLN COMPARISON:  None. FINDINGS: Cardiovascular: No filling defects in the pulmonary arteries to suggest pulmonary emboli. Heart is normal size. Calcifications throughout the left anterior descending coronary artery. Scattered aortic calcifications. No aneurysm. Mediastinum/Nodes: Small scattered and borderline sized mediastinal lymph nodes, likely reactive, none pathologically enlarged. Trachea and esophagus are unremarkable. Thyroid unremarkable. Lungs/Pleura: Linear dependent densities in the lower lobes compatible with atelectasis. No effusions. No confluent opacities or suspicious pulmonary nodules. Upper Abdomen: Gallbladder moderately distended. Musculoskeletal: Chest wall soft tissues are unremarkable. No acute bony abnormality. Review of the MIP images confirms the above findings. IMPRESSION: No evidence of pulmonary embolus. Dependent atelectasis in the lower lobes. Coronary artery  disease. Gallbladder distension.  See abdominal CT report today. Aortic  Atherosclerosis (ICD10-I70.0). Electronically Signed   By: Rolm Baptise M.D.   On: 11/03/2020 14:59   CT ABDOMEN PELVIS W CONTRAST  Result Date: 11/03/2020 CLINICAL DATA:  Epigastric pain, transaminitis.  Recent colonoscopy. EXAM: CT ABDOMEN AND PELVIS WITH CONTRAST TECHNIQUE: Multidetector CT imaging of the abdomen and pelvis was performed using the standard protocol following bolus administration of intravenous contrast. CONTRAST:  167mL OMNIPAQUE IOHEXOL 350 MG/ML SOLN COMPARISON:  04/07/2010 FINDINGS: Lower chest: Dependent atelectasis in the lung bases.  No effusions. Hepatobiliary: Gallbladder is distended. Possible slight surrounding stranding or fluid. No focal hepatic abnormality. No biliary ductal dilatation. Pancreas: No focal abnormality or ductal dilatation. Spleen: No focal abnormality.  Normal size. Adrenals/Urinary Tract: No adrenal abnormality. No focal renal abnormality. No stones or hydronephrosis. Urinary bladder is unremarkable. Stomach/Bowel: Postoperative changes in the sigmoid colon. Normal appendix. Stomach and small bowel decompressed, unremarkable. Vascular/Lymphatic: Aortic calcifications. No evidence of aneurysm or adenopathy. Reproductive: No visible focal abnormality. Other: No free fluid or free air. Small bilateral inguinal hernias and umbilical hernia containing fat. Musculoskeletal: No acute bony abnormality. IMPRESSION: Mild distention of the gallbladder with possible slight surrounding stranding or fluid. Consider further evaluation with right upper quadrant ultrasound. No free air following colonoscopy. Umbilical and bilateral inguinal hernias containing fat. Aortic atherosclerosis. Electronically Signed   By: Rolm Baptise M.D.   On: 11/03/2020 15:02   US Abdomen Limited  Result Date: 11/03/2020 CLINICAL DATA:  ruq pain, trasnaminitis, elevated bili EXAM: ULTRASOUND ABDOMEN LIMITED RIGHT UPPER  QUADRANT COMPARISON:  04/07/2010 and prior. FINDINGS: Gallbladder: No gallstones or wall thickening visualized. No sonographic [place] sign noted by sonographer. Common bile duct: Diameter: 5.3 mm Liver: No focal lesion identified. Increased parenchymal echogenicity. Portal vein is patent on color Doppler imaging with normal direction of blood flow towards the liver. Other: None. IMPRESSION: Hepatic steatosis. Otherwise unremarkable right upper quadrant ultrasound. Electronically Signed   By: Primitivo Gauze M.D.   On: 11/03/2020 12:57    Anti-infectives: Anti-infectives (From admission, onward)   Start     Dose/Rate Route Frequency Ordered Stop   11/04/20 0600  meropenem (MERREM) 1 g in sodium chloride 0.9 % 100 mL IVPB        1 g 200 mL/hr over 30 Minutes Intravenous Every 8 hours 11/04/20 0427        Assessment/Plan: POD 1 lap chole -[place] an ct without stones but gb appeared inflamed and he feels better -await lfts (had negative cholangiogram)- if still high will need GI to see him -will give clears, adat -drain likely can come out prior to dc -lovenox, scds  Rolm Bookbinder 11/05/2020

## 2020-11-05 NOTE — Progress Notes (Signed)
PROGRESS NOTE    Lawrence HENRICKSEN   DJS:[id number]  DOB: 27-Nov-1945  DOA: 11/03/2020     1  PCP: Shirline Frees, MD  CC: abdominal pain, nausea, fevers/chills  Hospital Course: Mr. Lawrence Wheeler is a 74 yo male with PMH obesity, HTN, GERD, OA who presented to [facility] after no improvement with epigastric pain that started Tuesday night following CLN that same day.  He has had worsening pain, subjective fevers/chills, SOB 2/2 pain, and ongoing nausea. No diarrhea.  He also reports no bowel movement since Tuesday nor flatus.  In the ER he was found to have elevated LFTs and TB.  CT abd/pelvis showed a distended GB with essentially pericholecystic fluid. A HIDA was ordered and patient was admitted and started on meropenem.  The morning after admission, surgery was consulted. HIDA was canceled and instead patient was felt more appropriate for undergoing CCY given his clinical presentation.  He tolerated surgery well and was able to be resumed on a diet the following morning and PT was also consulted.    Interval History:  Underwent lap chole yesterday. He is feeling tremendously better this am. Wife present bedside as well. He was asking for solid foods instead of liquids and did tolerate liquids for b'fast.  Endorses flatus but no BM yet but thinks b/c he hasn't eaten in days.  No N/V.  Abd pain much improved and now just surgical pains and tolerable.   Old records reviewed in assessment of this patient  ROS: Constitutional: positive for anorexia, chills, fevers and malaise, negative for night sweats, Respiratory: negative for cough and dyspnea on exertion, Cardiovascular: negative for chest pain and Gastrointestinal: positive for abdominal pain, constipation, jaundice and nausea, negative for diarrhea, melena and vomiting  Assessment & Plan: * Acute cholecystitis - patient has hepatic steatosis but no obvious stones or sludge seen on U/S or CT; but clinically to me he appeared to be more  consistent with acute cholecystitis. He was evaluated by surgery and taken to the OR the same day for lap chole - feeling tremendously better this am; asking for diet advancement; has flatus no BM yet; wants to get OOB too. Wife also bedside - -adv diet to heart healthy; instructed him to go slow with eating again - PT consulted - continue Lovenox - continue drain per surgery; keep meropenem on until drain removed  Abdominal distension - no obvious pathology seen in OR on exam - having flatus, no BM yet - diet advanced; monitor response and progress - ambulate   Obstructive jaundice - see acute cholecystitis - greatly appreciate GI and surgical involvement  Obesity, Class III, BMI 40-49.9 (morbid obesity) ([place]) -Weight loss and lifestyle modification will be recommended prior to discharge  Essential hypertension - resume home amlodipine - continue PRN meds as well   Antimicrobials: Meropenem 11/04/20>>current  DVT prophylaxis: Lovenox  Code Status: Full Family Communication: wife bedside Disposition Plan: Status is: Inpatient  Remains inpatient appropriate because:Ongoing diagnostic testing needed not appropriate for outpatient work up, IV treatments appropriate due to intensity of illness or inability to take PO and Inpatient level of care appropriate due to severity of illness   Dispo: The patient is from: Home              Anticipated d/c is to: Home              Anticipated d/c date is: 2 days              Patient currently  is not medically stable to d/c.   Objective: Blood pressure 124/79, pulse 71, temperature 97.8 F (36.6 C), temperature source Oral, resp. rate 16, height 5\' 8"  (1.727 m), weight 122.5 kg, SpO2 92 %.  Examination: General appearance: alert, cooperative, no distress and much more comfortable Head: Normocephalic, without obvious abnormality, atraumatic Eyes: EOMI; scleral icterus Lungs: clear to auscultation bilaterally Heart: regular rate and  rhythm and S1, S2 normal Abdomen: obese, soft; surgical incisions noted and covered with glue and on signs of surrounding infection; BS are present. Minimal TTP Extremities: no edema Skin: grossly jaundiced Neurologic: no focal deficits, no confusion   Consultants:   GI  Surgery  Procedures:     Data Reviewed: I have personally reviewed following labs and imaging studies Results for orders placed or performed during the hospital encounter of 11/03/20 (from the past 24 hour(s))  Glucose, capillary     Status: Abnormal   Collection Time: 11/04/20  5:20 PM  Result Value Ref Range   Glucose-Capillary 169 (H) 70 - 99 mg/dL  Glucose, capillary     Status: Abnormal   Collection Time: 11/04/20 11:23 PM  Result Value Ref Range   Glucose-Capillary 209 (H) 70 - 99 mg/dL   Comment 1 Notify RN   Glucose, capillary     Status: Abnormal   Collection Time: 11/05/20  6:16 AM  Result Value Ref Range   Glucose-Capillary 143 (H) 70 - 99 mg/dL   Comment 1 Notify RN   CBC with Differential/Platelet     Status: Abnormal   Collection Time: 11/05/20  6:50 AM  Result Value Ref Range   WBC 12.1 (H) 4.0 - 10.5 K/uL   RBC 3.92 (L) 4.22 - 5.81 MIL/uL   Hemoglobin 13.2 13.0 - 17.0 g/dL   HCT 39.4 39.0 - 52.0 %   MCV 100.5 (H) 80.0 - 100.0 fL   MCH 33.7 26.0 - 34.0 pg   MCHC 33.5 30.0 - 36.0 g/dL   RDW 13.6 11.5 - 15.5 %   Platelets 161 150 - 400 K/uL   nRBC 0.0 0.0 - 0.2 %   Neutrophils Relative % 89 %   Neutro Abs 10.7 (H) 1.7 - 7.7 K/uL   Lymphocytes Relative 7 %   Lymphs Abs 0.9 0.7 - 4.0 K/uL   Monocytes Relative 4 %   Monocytes Absolute 0.5 0.1 - 1.0 K/uL   Eosinophils Relative 0 %   Eosinophils Absolute 0.0 0.0 - 0.5 K/uL   Basophils Relative 0 %   Basophils Absolute 0.0 0.0 - 0.1 K/uL   Immature Granulocytes 0 %   Abs Immature Granulocytes 0.05 0.00 - 0.07 K/uL  Comprehensive metabolic panel     Status: Abnormal   Collection Time: 11/05/20  6:50 AM  Result Value Ref Range   Sodium  139 135 - 145 mmol/L   Potassium 4.0 3.5 - 5.1 mmol/L   Chloride 105 98 - 111 mmol/L   CO2 25 22 - 32 mmol/L   Glucose, Bld 152 (H) 70 - 99 mg/dL   BUN 15 8 - 23 mg/dL   Creatinine, Ser 1.02 0.61 - 1.24 mg/dL   Calcium 8.2 (L) 8.9 - 10.3 mg/dL   Total Protein 6.6 6.5 - 8.1 g/dL   Albumin 3.0 (L) 3.5 - 5.0 g/dL   AST 120 (H) 15 - 41 U/L   ALT 205 (H) 0 - 44 U/L   Alkaline Phosphatase 130 (H) 38 - 126 U/L   Total Bilirubin 4.7 (H) 0.3 - 1.2  mg/dL   GFR, Estimated >60 >60 mL/min   Anion gap 9 5 - 15  Magnesium     Status: None   Collection Time: 11/05/20  6:50 AM  Result Value Ref Range   Magnesium 2.4 1.7 - 2.4 mg/dL  Phosphorus     Status: Abnormal   Collection Time: 11/05/20  6:50 AM  Result Value Ref Range   Phosphorus 2.3 (L) 2.5 - 4.6 mg/dL  Glucose, capillary     Status: Abnormal   Collection Time: 11/05/20  7:39 AM  Result Value Ref Range   Glucose-Capillary 128 (H) 70 - 99 mg/dL    Recent Results (from the past 240 hour(s))  Resp Panel by RT-PCR (Flu A&B, Covid) Nasopharyngeal Swab     Status: None   Collection Time: 11/03/20  4:36 PM   Specimen: Nasopharyngeal Swab; Nasopharyngeal(NP) swabs in vial transport medium  Result Value Ref Range Status   SARS Coronavirus 2 by RT PCR NEGATIVE NEGATIVE Final    Comment: (NOTE) SARS-CoV-2 target nucleic acids are NOT DETECTED.  The SARS-CoV-2 RNA is generally detectable in upper respiratory specimens during the acute phase of infection. The lowest concentration of SARS-CoV-2 viral copies this assay can detect is 138 copies/mL. A negative result does not preclude SARS-Cov-2 infection and should not be used as the sole basis for treatment or other patient management decisions. A negative result may occur with  improper specimen collection/handling, submission of specimen other than nasopharyngeal swab, presence of viral mutation(s) within the areas targeted by this assay, and inadequate number of viral copies(<138  copies/mL). A negative result must be combined with clinical observations, patient history, and epidemiological information. The expected result is Negative.  Fact Sheet for Patients:  EntrepreneurPulse.com.au  Fact Sheet for Healthcare Providers:  IncredibleEmployment.be  This test is no t yet approved or cleared by the [place] FDA and  has been authorized for detection and/or diagnosis of SARS-CoV-2 by FDA under an Emergency Use Authorization (EUA). This EUA will remain  in effect (meaning this test can be used) for the duration of the COVID-19 declaration under Section 564(b)(1) of the Act, 21 U.S.C.section 360bbb-3(b)(1), unless the authorization is terminated  or revoked sooner.       Influenza A by PCR NEGATIVE NEGATIVE Final   Influenza B by PCR NEGATIVE NEGATIVE Final    Comment: (NOTE) The Xpert Xpress SARS-CoV-2/FLU/RSV plus assay is intended as an aid in the diagnosis of influenza from Nasopharyngeal swab specimens and should not be used as a sole basis for treatment. Nasal washings and aspirates are unacceptable for Xpert Xpress SARS-CoV-2/FLU/RSV testing.  Fact Sheet for Patients: EntrepreneurPulse.com.au  Fact Sheet for Healthcare Providers: IncredibleEmployment.be  This test is not yet approved or cleared by the [place] FDA and has been authorized for detection and/or diagnosis of SARS-CoV-2 by FDA under an Emergency Use Authorization (EUA). This EUA will remain in effect (meaning this test can be used) for the duration of the COVID-19 declaration under Section 564(b)(1) of the Act, 21 U.S.C. section 360bbb-3(b)(1), unless the authorization is terminated or revoked.  Performed at [facility], [address] [place], [place] [address]   Culture, blood (routine x 2)     Status: None (Preliminary result)   Collection Time: 11/03/20 10:39 PM   Specimen: BLOOD   Result Value Ref Range Status   Specimen Description   Final    BLOOD LEFT WRIST Performed at [facility] [address] [place], [place]  [address]    Special Requests   Final    BOTTLES DRAWN AEROBIC ONLY Blood Culture adequate volume Performed at [place] [address] [place], [place] [address]    Culture   Final    NO GROWTH 1 DAY Performed at [facility], [place] [address], [place], [place] [address]    Report Status PENDING  Incomplete  Culture, blood (routine x 2)     Status: None (Preliminary result)   Collection Time: 11/03/20 10:39 PM   Specimen: BLOOD  Result Value Ref Range Status   Specimen Description   Final    BLOOD RIGHT WRIST Performed at [facility] [address] [place], [place] [address]    Special Requests   Final    BOTTLES DRAWN AEROBIC ONLY Blood Culture adequate volume Performed at [facility] [address], [place], [place] [address]    Culture   Final    NO GROWTH 1 DAY Performed at [facility], [place] [address], [place], [place] [address]    Report Status PENDING  Incomplete     Radiology Studies: DG Cholangiogram Operative  Result Date: 11/04/2020 CLINICAL DATA:  Laparoscopic cholecystectomy with intraoperative cholangiogram. EXAM: INTRAOPERATIVE CHOLANGIOGRAM TECHNIQUE: Cholangiographic images from the C-arm fluoroscopic device were submitted for interpretation post-operatively. Please see the procedural report for the amount of contrast and the fluoroscopy time utilized. COMPARISON:  CT abdomen/pelvis 11/03/2020 FINDINGS: Saved cine loops submitted for review. The images demonstrate cannulation of the cystic duct remanent with intraoperative cholangiogram. No evidence of stenosis, stricture, or choledocholithiasis. Contrast material passes freely through the ampulla and into the duodenum. Slight reflux into the main pancreatic duct. IMPRESSION:  Negative intraoperative cholangiogram. Electronically Signed   By: Jacqulynn Cadet M.D.   On: 11/04/2020 15:06   CT Angio Chest PE W and/or Wo Contrast  Result Date: 11/03/2020 CLINICAL DATA:  Epigastric pain, transaminitis. Shortness of breath. EXAM: CT ANGIOGRAPHY CHEST WITH CONTRAST TECHNIQUE: Multidetector CT imaging of the chest was performed using the standard protocol during bolus administration of intravenous contrast. Multiplanar CT image reconstructions and MIPs were obtained to evaluate the vascular anatomy. CONTRAST:  126mL OMNIPAQUE IOHEXOL 350 MG/ML SOLN COMPARISON:  None. FINDINGS: Cardiovascular: No filling defects in the pulmonary arteries to suggest pulmonary emboli. Heart is normal size. Calcifications throughout the left anterior descending coronary artery. Scattered aortic calcifications. No aneurysm. Mediastinum/Nodes: Small scattered and borderline sized mediastinal lymph nodes, likely reactive, none pathologically enlarged. Trachea and esophagus are unremarkable. Thyroid unremarkable. Lungs/Pleura: Linear dependent densities in the lower lobes compatible with atelectasis. No effusions. No confluent opacities or suspicious pulmonary nodules. Upper Abdomen: Gallbladder moderately distended. Musculoskeletal: Chest wall soft tissues are unremarkable. No acute bony abnormality. Review of the MIP images confirms the above findings. IMPRESSION: No evidence of pulmonary embolus. Dependent atelectasis in the lower lobes. Coronary artery disease. Gallbladder distension.  See abdominal CT report today. Aortic Atherosclerosis (ICD10-I70.0). Electronically Signed   By: Rolm Baptise M.D.   On: 11/03/2020 14:59   CT ABDOMEN PELVIS W CONTRAST  Result Date: 11/03/2020 CLINICAL DATA:  Epigastric pain, transaminitis.  Recent colonoscopy. EXAM: CT ABDOMEN AND PELVIS WITH CONTRAST TECHNIQUE: Multidetector CT imaging of the abdomen and pelvis was performed using the standard protocol following bolus  administration of intravenous contrast. CONTRAST:  168mL OMNIPAQUE IOHEXOL 350 MG/ML SOLN COMPARISON:  04/07/2010 FINDINGS: Lower chest: Dependent atelectasis in the lung bases.  No effusions. Hepatobiliary: Gallbladder is distended. Possible slight surrounding stranding  or fluid. No focal hepatic abnormality. No biliary ductal dilatation. Pancreas: No focal abnormality or ductal dilatation. Spleen: No focal abnormality.  Normal size. Adrenals/Urinary Tract: No adrenal abnormality. No focal renal abnormality. No stones or hydronephrosis. Urinary bladder is unremarkable. Stomach/Bowel: Postoperative changes in the sigmoid colon. Normal appendix. Stomach and small bowel decompressed, unremarkable. Vascular/Lymphatic: Aortic calcifications. No evidence of aneurysm or adenopathy. Reproductive: No visible focal abnormality. Other: No free fluid or free air. Small bilateral inguinal hernias and umbilical hernia containing fat. Musculoskeletal: No acute bony abnormality. IMPRESSION: Mild distention of the gallbladder with possible slight surrounding stranding or fluid. Consider further evaluation with right upper quadrant ultrasound. No free air following colonoscopy. Umbilical and bilateral inguinal hernias containing fat. Aortic atherosclerosis. Electronically Signed   By: Rolm Baptise M.D.   On: 11/03/2020 15:02   DG Cholangiogram Operative  Final Result    CT Angio Chest PE W and/or Wo Contrast  Final Result    CT ABDOMEN PELVIS W CONTRAST  Final Result    US Abdomen Limited  Final Result    DG Chest 2 View  Final Result      Scheduled Meds: . acetaminophen  500 mg Oral Q6H  . amLODipine  5 mg Oral QPM  . enoxaparin (LOVENOX) injection  30 mg Subcutaneous Q12H  . lidocaine  1 patch Transdermal Q24H   PRN Meds: hydrALAZINE, HYDROmorphone (DILAUDID) injection, labetalol, methocarbamol, ondansetron (ZOFRAN) IV, oxyCODONE Continuous Infusions: . meropenem (MERREM) IV 1 g (11/05/20 8138)      LOS: 1 day  Time spent: Greater than 50% of the 35 minute visit was spent in counseling/coordination of care for the patient as laid out in the A&P.   Dwyane Dee, MD [facility] 11/05/2020, 1:21 PM

## 2020-11-06 LAB — COMPREHENSIVE METABOLIC PANEL
ALT: 190 U/L — ABNORMAL HIGH (ref 0–44)
AST: 99 U/L — ABNORMAL HIGH (ref 15–41)
Albumin: 3.1 g/dL — ABNORMAL LOW (ref 3.5–5.0)
Alkaline Phosphatase: 154 U/L — ABNORMAL HIGH (ref 38–126)
Anion gap: 10 (ref 5–15)
BUN: 20 mg/dL (ref 8–23)
CO2: 26 mmol/L (ref 22–32)
Calcium: 8.2 mg/dL — ABNORMAL LOW (ref 8.9–10.3)
Chloride: 102 mmol/L (ref 98–111)
Creatinine, Ser: 0.94 mg/dL (ref 0.61–1.24)
GFR, Estimated: >60 mL/min (ref >60)
Glucose, Bld: 121 mg/dL — ABNORMAL HIGH (ref 70–99)
Potassium: 4.2 mmol/L (ref 3.5–5.1)
Sodium: 138 mmol/L (ref 135–145)
Total Bilirubin: 3.2 mg/dL — ABNORMAL HIGH (ref 0.3–1.2)
Total Protein: 6.9 g/dL (ref 6.5–8.1)

## 2020-11-06 LAB — CBC WITH DIFFERENTIAL/PLATELET
Abs Immature Granulocytes: 0.07 10*3/uL (ref 0.00–0.07)
Basophils Absolute: 0 10*3/uL (ref 0.0–0.1)
Basophils Relative: 0 %
Eosinophils Absolute: 0 10*3/uL (ref 0.0–0.5)
Eosinophils Relative: 0 %
HCT: 43.1 % (ref 39.0–52.0)
Hemoglobin: 14.7 g/dL (ref 13.0–17.0)
Immature Granulocytes: 1 %
Lymphocytes Relative: 13 %
Lymphs Abs: 1.9 10*3/uL (ref 0.7–4.0)
MCH: 33.4 pg (ref 26.0–34.0)
MCHC: 34.1 g/dL (ref 30.0–36.0)
MCV: 98 fL (ref 80.0–100.0)
Monocytes Absolute: 0.9 10*3/uL (ref 0.1–1.0)
Monocytes Relative: 6 %
Neutro Abs: 11.4 10*3/uL — ABNORMAL HIGH (ref 1.7–7.7)
Neutrophils Relative %: 80 %
Platelets: 188 10*3/uL (ref 150–400)
RBC: 4.4 MIL/uL (ref 4.22–5.81)
RDW: 13.4 % (ref 11.5–15.5)
WBC: 14.3 10*3/uL — ABNORMAL HIGH (ref 4.0–10.5)
nRBC: 0 % (ref 0.0–0.2)

## 2020-11-06 LAB — GLUCOSE, CAPILLARY
Glucose-Capillary: 108 mg/dL — ABNORMAL HIGH (ref 70–99)
Glucose-Capillary: 150 mg/dL — ABNORMAL HIGH (ref 70–99)

## 2020-11-06 LAB — PHOSPHORUS: Phosphorus: 2.5 mg/dL (ref 2.5–4.6)

## 2020-11-06 LAB — MAGNESIUM: Magnesium: 2.5 mg/dL — ABNORMAL HIGH (ref 1.7–2.4)

## 2020-11-06 MED ORDER — ACETAMINOPHEN 500 MG PO TABS: 500.0000 mg | ORAL_TABLET | Freq: Four times a day (QID) | ORAL | 0 refills | Status: AC | PRN

## 2020-11-06 MED ORDER — OXYCODONE HCL 5 MG PO TABS: 5.0000 mg | ORAL_TABLET | ORAL | 0 refills | Status: AC | PRN

## 2020-11-06 MED ORDER — IBUPROFEN 200 MG PO TABS: 400.0000 mg | ORAL_TABLET | Freq: Two times a day (BID) | ORAL | 0 refills | Status: AC | PRN

## 2020-11-06 MED ORDER — PANTOPRAZOLE SODIUM 40 MG PO TBEC
40.0000 mg | DELAYED_RELEASE_TABLET | Freq: Every morning | ORAL | Status: DC
  Administered 2020-11-06: 11:00:00 40 mg via ORAL
  Filled 2020-11-06: qty 1

## 2020-11-06 NOTE — Discharge Instructions (Signed)
Do not resume atorvastatin (Lipitor) until your primary care tells you to do so.  Do not take more than 4 tablets of Tylenol a day    [facility] LAP[facility]OSCOP[facility]: POST OP INSTRUCTIONS Always review your discharge instruction sheet given to you by the facility where your surgery was performed. IF YOU HAVE DISABILITY OR FAMILY LEAVE FORMS, YOU MUST BRING THEM TO THE OFFICE FOR PROCESSING.   DO NOT GIVE THEM TO YOUR DOCTOR.  PAIN CONTROL  1. First take acetaminophen (Tylenol) AND/or ibuprofen (Advil) to control your pain after surgery.  Follow directions on package.  Taking acetaminophen (Tylenol) and/or ibuprofen (Advil) regularly after surgery will help to control your pain and lower the amount of prescription pain medication you may need.  You should not take more than 3,000 mg (3 grams) of acetaminophen (Tylenol) in 24 hours.  You should not take ibuprofen (Advil), aleve, motrin, naprosyn or other NSAIDS if you have a history of stomach ulcers or chronic kidney disease.  2. A prescription for pain medication may be given to you upon discharge.  Take your pain medication as prescribed, if you still have uncontrolled pain after taking acetaminophen (Tylenol) or ibuprofen (Advil). 3. Use ice packs to help control pain. 4. If you need a refill on your pain medication, please contact your pharmacy.  They will contact our office to request authorization. Prescriptions will not be filled after 5pm or on week-ends.  HOME MEDICATIONS 5. Take your usually prescribed medications unless otherwise directed.  DIET 6. You should follow a light diet the first few days after arrival home.  Be sure to include lots of fluids daily. Avoid fatty, fried foods.   CONSTIPATION 7. It is common to experience some constipation after surgery and if you are taking pain medication.  Increasing fluid intake and taking a stool softener (such as Colace) will usually help or prevent this problem  from occurring.  A mild laxative (Milk of Magnesia or Miralax) should be taken according to package instructions if there are no bowel movements after 48 hours.  WOUND/INCISION CARE 8. Most patients will experience some swelling and bruising in the area of the incisions.  Ice packs will help.  Swelling and bruising can take several days to resolve.  9. Unless discharge instructions indicate otherwise, follow guidelines below  a. STERI-STRIPS - you may remove your outer bandages 48 hours after surgery, and you may shower at that time.  You have steri-strips (small skin tapes) in place directly over the incision.  These strips should be left on the skin for 7-10 days.   b. DERMABOND/SKIN GLUE - you may shower in 24 hours.  The glue will flake off over the next 2-3 weeks. 10. Any sutures or staples will be removed at the office during your follow-up visit.  ACTIVITIES 11. You may resume regular (light) daily activities beginning the next day--such as daily self-care, walking, climbing stairs--gradually increasing activities as tolerated.  You may have sexual intercourse when it is comfortable.  Refrain from any heavy lifting or straining until approved by your doctor. a. You may drive when you are no longer taking prescription pain medication, you can comfortably wear a seatbelt, and you can safely maneuver your car and apply brakes.  FOLLOW-UP 12. You should see your doctor in the office for a follow-up appointment approximately 2-3 weeks after your surgery.  You should have been given your post-op/follow-up appointment when your surgery was scheduled.  If you did not  receive a post-op/follow-up appointment, make sure that you call for this appointment within a day or two after you arrive home to insure a convenient appointment time.  WHEN TO CALL YOUR DOCTOR: 1. Fever over 101.0 2. Inability to urinate 3. Continued bleeding from incision. 4. Increased pain, redness, or drainage from the  incision. 5. Increasing abdominal pain  The clinic staff is available to answer your questions during regular business hours.  Please don't hesitate to call and ask to speak to one of the nurses for clinical concerns.  If you have a medical emergency, go to the nearest emergency room or call 911.  A surgeon from [facility] is always on call at the hospital. [address], [place], [place], [place]  [address] ? [address], [place], [place]   [address] [phone] ? [phone] ? FAX [phone] Web site: [email or web address]

## 2020-11-06 NOTE — Progress Notes (Signed)
2 Days Post-Op   Subjective/Chief Complaint: Feels great, tol diet, having flatus   Objective: Vital signs in last 24 hours: Temp:  [97.8 F (36.6 C)-97.9 F (36.6 C)] 97.8 F (36.6 C) (12/12 0605) Pulse Rate:  [55-72] 55 (12/12 0605) Resp:  [16-18] 16 (12/12 0605) BP: (123-159)/(68-94) 159/94 (12/12 0605) SpO2:  [95 %-98 %] 98 % (12/12 0605) Last BM Date: 11/01/20  Intake/Output from previous day: 12/11 0701 - 12/12 0700 In: -  Out: 200 [Urine:200] Intake/Output this shift: No intake/output data recorded.  ab soft nontender nondistended drain serous incisions clean  Lab Results:  Recent Labs    11/05/20 0650 11/06/20 0603  WBC 12.1* 14.3*  HGB 13.2 14.7  HCT 39.4 43.1  PLT 161 188   BMET Recent Labs    11/05/20 0650 11/06/20 0603  NA 139 138  K 4.0 4.2  CL 105 102  CO2 25 26  GLUCOSE 152* 121*  BUN 15 20  CREATININE 1.02 0.94  CALCIUM 8.2* 8.2*   PT/INR Recent Labs    11/03/20 1134  LABPROT 15.0  INR 1.2   ABG No results for input(s): PHART, HCO3 in the last 72 hours.  Invalid input(s): PCO2, PO2  Studies/Results: DG Cholangiogram Operative  Result Date: 11/04/2020 CLINICAL DATA:  Laparoscopic cholecystectomy with intraoperative cholangiogram. EXAM: INTRAOPERATIVE CHOLANGIOGRAM TECHNIQUE: Cholangiographic images from the C-arm fluoroscopic device were submitted for interpretation post-operatively. Please see the procedural report for the amount of contrast and the fluoroscopy time utilized. COMPARISON:  CT abdomen/pelvis 11/03/2020 FINDINGS: Saved cine loops submitted for review. The images demonstrate cannulation of the cystic duct remanent with intraoperative cholangiogram. No evidence of stenosis, stricture, or choledocholithiasis. Contrast material passes freely through the ampulla and into the duodenum. Slight reflux into the main pancreatic duct. IMPRESSION: Negative intraoperative cholangiogram. Electronically Signed   By: Jacqulynn Cadet  M.D.   On: 11/04/2020 15:06    Anti-infectives: Anti-infectives (From admission, onward)   Start     Dose/Rate Route Frequency Ordered Stop   11/04/20 0600  meropenem (MERREM) 1 g in sodium chloride 0.9 % 100 mL IVPB        1 g 200 mL/hr over 30 Minutes Intravenous Every 8 hours 11/04/20 0427        Assessment/Plan: POD 2 lap chole -[place] an ct without stones but gb appeared inflamed and he feels better -lfts improving -reg diett -drain  can come out prior to dc -can dc home today from my standpoint -lovenox, scds  Rolm Bookbinder 11/06/2020

## 2020-11-07 ENCOUNTER — Encounter (HOSPITAL_COMMUNITY): Payer: Self-pay | Admitting: Surgery

## 2020-11-07 LAB — SURGICAL PATHOLOGY

## 2020-11-07 NOTE — Anesthesia Postprocedure Evaluation (Signed)
Anesthesia Post Note  Patient: Lawrence Wheeler  Procedure(s) Performed: LAPAROSCOPIC CHOLECYSTECTOMY WITH INTRAOPERATIVE CHOLANGIOGRAM AND UMBILICAL HERNIA REPAIR, UPPER ENDOSCOPY (N/A Abdomen)     Patient location during evaluation: PACU Anesthesia Type: General Level of consciousness: awake and alert Pain management: pain level controlled Vital Signs Assessment: post-procedure vital signs reviewed and stable Respiratory status: spontaneous breathing, nonlabored ventilation, respiratory function stable and patient connected to nasal cannula oxygen Cardiovascular status: blood pressure returned to baseline and stable Postop Assessment: no apparent nausea or vomiting Anesthetic complications: no   No complications documented.  Last Vitals:  Vitals:   11/05/20 2008 11/06/20 0605  BP: 137/75 (!) 159/94  Pulse: 72 (!) 55  Resp: 18 16  Temp: 36.6 C 36.6 C  SpO2: 95% 98%    Last Pain:  Vitals:   11/06/20 1200  TempSrc:   PainSc: 0-No pain   Pain Goal: Patients Stated Pain Goal: 2 (11/05/20 2215)                 [place]

## 2020-11-07 NOTE — Discharge Summary (Signed)
Physician Discharge Summary   Lawrence Wheeler MEQ:[id number] DOB: Jan 02, 1946 DOA: 11/03/2020  PCP: Lawrence Frees, MD  Admit date: 11/03/2020 Discharge date: 11/07/2020  Admitted From: home Disposition:  home  Discharging physician: Lawrence Dee, MD  Recommendations for Outpatient Follow-up:  1) Repeat LFTs at follow-up. Patient told to hold statin until follow-up with PCP   Patient discharged to home in Discharge Condition: stable CODE STATUS: Full Diet recommendation:  Diet Orders (From admission, onward)    Start     Ordered   11/06/20 0000  Diet - low sodium heart healthy        11/06/20 1123          Hospital Course: Lawrence Wheeler is a 74 yo male with PMH obesity, HTN, GERD, OA who presented to [facility] after no improvement with epigastric pain that started Tuesday night following CLN that same day.  He has had worsening pain, subjective fevers/chills, SOB 2/2 pain, and ongoing nausea. No diarrhea.  He also reports no bowel movement since Tuesday nor flatus.  In the ER he was found to have elevated LFTs and TB.  CT abd/pelvis showed a distended GB with essentially pericholecystic fluid. A HIDA was ordered and patient was admitted and started on meropenem.  The morning after admission, surgery was consulted. HIDA was canceled and instead patient was felt more appropriate for undergoing CCY given his clinical presentation.  He tolerated surgery well and was able to be resumed on a diet the following morning and PT was also consulted.  He continued to clinically improve and diet was advanced further. He ambulated well with no difficulty in the hallway. His drain was removed prior to discharge and he did not require any further antibiotics at discharge as well. He was instructed to follow-up outpatient with his primary care provider for repeat LFT check and follow-up with surgery as well.   * Acute cholecystitis - patient has hepatic steatosis but no obvious stones or sludge seen  on U/S or CT; but clinically to me he appeared to be more consistent with acute cholecystitis. He was evaluated by surgery and taken to the OR the same day for lap chole - diet advanced patient tolerated; ambulated well - drain removed on 12/12 and no abx needed at discharge - needs outpt repeat CMP/LFTs; instructed him to hold off on resuming statin until seen by PCP and having labs checked  Abdominal distension - no obvious pathology seen in OR on exam - having flatus, no BM yet - ambulating well - bowel regimen recommended at discharge but eating well and no N/V  Obstructive jaundice - see acute cholecystitis - greatly appreciate GI and surgical involvement  Obesity, Class III, BMI 40-49.9 (morbid obesity) ([place]) -Weight loss and lifestyle modification will be recommended prior to discharge  Essential hypertension - resume home amlodipine - continue PRN meds as well    The patient's chronic medical conditions were treated accordingly per the patient's home medication regimen except as noted.  On day of discharge, patient was felt deemed stable for discharge. Patient/family member advised to call PCP or come back to ER if needed.   Principal Diagnosis: Acute cholecystitis  Discharge Diagnoses: Active Hospital Problems   Diagnosis Date Noted  . Acute cholecystitis 11/04/2020    Priority: High  . Abdominal distension 11/04/2020    Priority: Medium  . Obstructive jaundice 11/03/2020    Priority: Medium  . Obesity, Class III, BMI 40-49.9 (morbid obesity) ([place]) 11/04/2020  . Essential hypertension 11/03/2020  Resolved Hospital Problems  No resolved problems to display.    Discharge Instructions    Diet - low sodium heart healthy   Complete by: As directed    Discharge wound care:   Complete by: As directed    Cover drain site with band-aid or gauze when showering until healed   Increase activity slowly   Complete by: As directed      Allergies as of 11/06/2020       Reactions   Flagyl [metronidazole] Nausea Only   Penicillins Other (See Comments)   REACTION: rash and swelling  Has patient had a PCN reaction causing immediate rash, facial/tongue/throat swelling, SOB or lightheadedness with hypotension: Yes Has patient had a PCN reaction causing severe rash involving mucus membranes or skin necrosis: Yes Has patient had a PCN reaction that required hospitalization No Has patient had a PCN reaction occurring within the last 10 years: Yes If all of the above answers are "NO", then may proceed with Cephalosporin use.   Doxycycline Nausea Only   Prednisone Nausea Only   Sulfa Antibiotics Nausea Only   Ciprofloxacin Nausea And Vomiting      Medication List    TAKE these medications   acetaminophen 500 MG tablet Commonly known as: TYLENOL Take 1 tablet (500 mg total) by mouth every 6 (six) hours as needed.   amLODipine 5 MG tablet Commonly known as: NORVASC Take 1 tablet (5 mg total) by mouth daily. What changed: when to take this   atorvastatin 20 MG tablet Commonly known as: LIPITOR Take 20 mg by mouth every evening.   ibuprofen 200 MG tablet Commonly known as: ADVIL Take 2 tablets (400 mg total) by mouth 2 (two) times daily as needed (ARTHRITIS PAIN). What changed: how much to take   oxyCODONE 5 MG immediate release tablet Commonly known as: Oxy IR/ROXICODONE Take 1 tablet (5 mg total) by mouth every 4 (four) hours as needed (5 mg mod pain (3-6) 10 mg severe (7-10)).   pantoprazole 40 MG tablet Commonly known as: PROTONIX Take 40 mg by mouth every morning.            Discharge Care Instructions  (From admission, onward)         Start     Ordered   11/06/20 0000  Discharge wound care:       Comments: Cover drain site with band-aid or gauze when showering until healed   11/06/20 1123          Follow-up [facility] Follow up.   Specialty: General Surgery Why: our office is scheduling you  for post-operative follow up. please call to confirm appointment date/time. please arrive 15 minutes early to your appointment. Contact information: [place] [phone]             Allergies  Allergen Reactions  . Flagyl [Metronidazole] Nausea Only  . Penicillins Other (See Comments)    REACTION: rash and swelling  Has patient had a PCN reaction causing immediate rash, facial/tongue/throat swelling, SOB or lightheadedness with hypotension: Yes Has patient had a PCN reaction causing severe rash involving mucus membranes or skin necrosis: Yes Has patient had a PCN reaction that required hospitalization No Has patient had a PCN reaction occurring within the last 10 years: Yes If all of the above answers are "NO", then may proceed with Cephalosporin use.   Marland Kitchen Doxycycline Nausea Only  . Prednisone Nausea Only  .  Sulfa Antibiotics Nausea Only  . Ciprofloxacin Nausea And Vomiting    Consultations: Surgery   Discharge Exam: BP (!) 159/94 (BP Location: Right Arm)   Pulse (!) 55   Temp 97.8 F (36.6 C) (Oral)   Resp 16   Ht 5\' 8"  (1.727 m)   Wt 122.5 kg   SpO2 98%   BMI 41.06 kg/m  General appearance: alert, cooperative, no distress and much more comfortable Head: Normocephalic, without obvious abnormality, atraumatic Eyes: EOMI; scleral icterus Lungs: clear to auscultation bilaterally Heart: regular rate and rhythm and S1, S2 normal Abdomen: obese, soft; surgical incisions noted and covered with glue and on signs of surrounding infection; BS are present. Minimal TTP Extremities: no edema Skin: grossly jaundiced Neurologic: no focal deficits, no confusion   The results of significant diagnostics from this hospitalization (including imaging, microbiology, ancillary and laboratory) are listed below for reference.   Microbiology: Recent Results (from the past 240 hour(s))  Resp Panel by RT-PCR (Flu A&B, Covid)  Nasopharyngeal Swab     Status: None   Collection Time: 11/03/20  4:36 PM   Specimen: Nasopharyngeal Swab; Nasopharyngeal(NP) swabs in vial transport medium  Result Value Ref Range Status   SARS Coronavirus 2 by RT PCR NEGATIVE NEGATIVE Final    Comment: (NOTE) SARS-CoV-2 target nucleic acids are NOT DETECTED.  The SARS-CoV-2 RNA is generally detectable in upper respiratory specimens during the acute phase of infection. The lowest concentration of SARS-CoV-2 viral copies this assay can detect is 138 copies/mL. A negative result does not preclude SARS-Cov-2 infection and should not be used as the sole basis for treatment or other patient management decisions. A negative result may occur with  improper specimen collection/handling, submission of specimen other than nasopharyngeal swab, presence of viral mutation(s) within the areas targeted by this assay, and inadequate number of viral copies(<138 copies/mL). A negative result must be combined with clinical observations, patient history, and epidemiological information. The expected result is Negative.  Fact Sheet for Patients:  EntrepreneurPulse.com.au  Fact Sheet for Healthcare Providers:  IncredibleEmployment.be  This test is no t yet approved or cleared by the [place] FDA and  has been authorized for detection and/or diagnosis of SARS-CoV-2 by FDA under an Emergency Use Authorization (EUA). This EUA will remain  in effect (meaning this test can be used) for the duration of the COVID-19 declaration under Section 564(b)(1) of the Act, 21 U.S.C.section 360bbb-3(b)(1), unless the authorization is terminated  or revoked sooner.       Influenza A by PCR NEGATIVE NEGATIVE Final   Influenza B by PCR NEGATIVE NEGATIVE Final    Comment: (NOTE) The Xpert Xpress SARS-CoV-2/FLU/RSV plus assay is intended as an aid in the diagnosis of influenza from Nasopharyngeal swab specimens and should not be  used as a sole basis for treatment. Nasal washings and aspirates are unacceptable for Xpert Xpress SARS-CoV-2/FLU/RSV testing.  Fact Sheet for Patients: EntrepreneurPulse.com.au  Fact Sheet for Healthcare Providers: IncredibleEmployment.be  This test is not yet approved or cleared by the [place] FDA and has been authorized for detection and/or diagnosis of SARS-CoV-2 by FDA under an Emergency Use Authorization (EUA). This EUA will remain in effect (meaning this test can be used) for the duration of the COVID-19 declaration under Section 564(b)(1) of the Act, 21 U.S.C. section 360bbb-3(b)(1), unless the authorization is terminated or revoked.  Performed at [facility], [address] [place], [place] [address]   Culture, blood (routine x 2)  Status: None (Preliminary result)   Collection Time: 11/03/20 10:39 PM   Specimen: BLOOD  Result Value Ref Range Status   Specimen Description   Final    BLOOD LEFT WRIST Performed at [facility] [address], [place], [place] [address]    Special Requests   Final    BOTTLES DRAWN AEROBIC ONLY Blood Culture adequate volume Performed at [facility] [address] [place], [place] [address]    Culture   Final    NO GROWTH 3 DAYS Performed at [facility], [place] [address], [place], [place] [address]    Report Status PENDING  Incomplete  Culture, blood (routine x 2)     Status: None (Preliminary result)   Collection Time: 11/03/20 10:39 PM   Specimen: BLOOD  Result Value Ref Range Status   Specimen Description   Final    BLOOD RIGHT WRIST Performed at [facility] [address] [place], [place] [address]    Special Requests   Final    BOTTLES DRAWN AEROBIC ONLY Blood Culture adequate volume Performed at [facility] [address], [place], [place] [address]    Culture   Final    NO  GROWTH 3 DAYS Performed at [facility], [place] [address] [place],  [address]    Report Status PENDING  Incomplete     Labs: BNP (last 3 results) No results for input(s): BNP in the last 8760 hours. Basic Metabolic Panel: Recent Labs  Lab 11/03/20 1050 11/04/20 0645 11/05/20 0650 11/06/20 0603  NA 134* 137 139 138  K 3.7 3.5 4.0 4.2  CL 101 104 105 102  CO2 24 24 25 26   GLUCOSE 143* 138* 152* 121*  BUN 14 13 15 20   CREATININE 0.99 0.85 1.02 0.94  CALCIUM 8.6* 8.3* 8.2* 8.2*  MG  --   --  2.4 2.5*  PHOS  --   --  2.3* 2.5   Liver Function Tests: Recent Labs  Lab 11/03/20 1050 11/04/20 0645 11/05/20 0650 11/06/20 0603  AST 191* 142* 120* 99*  ALT 298* 227* 205* 190*  ALKPHOS 113 124 130* 154*  BILITOT 9.5* 9.9* 4.7* 3.2*  PROT 7.5 7.1 6.6 6.9  ALBUMIN 3.7 3.4* 3.0* 3.1*   Recent Labs  Lab 11/03/20 1050  LIPASE 22   No results for input(s): AMMONIA in the last 168 hours. CBC: Recent Labs  Lab 11/03/20 1050 11/04/20 0645 11/05/20 0650 11/06/20 0603  WBC 11.7* 9.4 12.1* 14.3*  NEUTROABS  --   --  10.7* 11.4*  HGB 14.8 14.0 13.2 14.7  HCT 41.7 41.9 39.4 43.1  MCV 95.4 101.5* 100.5* 98.0  PLT 168 152 161 188   Cardiac Enzymes: No results for input(s): CKTOTAL, CKMB, CKMBINDEX, TROPONINI in the last 168 hours. BNP: Invalid input(s): POCBNP CBG: Recent Labs  Lab 11/05/20 0616 11/05/20 0739 11/05/20 1614 11/06/20 0012 11/06/20 0753  GLUCAP 143* 128* 178* 150* 108*   D-Dimer No results for input(s): DDIMER in the last 72 hours. Hgb A1c No results for input(s): HGBA1C in the last 72 hours. Lipid Profile No results for input(s): CHOL, HDL, LDLCALC, TRIG, CHOLHDL, LDLDIRECT in the last 72 hours. Thyroid function studies No results for input(s): TSH, T4TOTAL, T3FREE, THYROIDAB in the last 72 hours.  Invalid input(s): FREET3 Anemia work up No results for input(s): VITAMINB12, FOLATE, FERRITIN, TIBC, IRON, RETICCTPCT in the last 72  hours. Urinalysis    Component Value  Date/Time   COLORURINE ORANGE (A) 11/03/2020 1134   APPEARANCEUR CLEAR 11/03/2020 1134   LABSPEC 1.010 11/03/2020 1134   PHURINE 6.0 11/03/2020 1134   GLUCOSEU NEGATIVE 11/03/2020 1134   GLUCOSEU NEG mg/dL 03/23/2010 1850   HGBUR TRACE (A) 11/03/2020 1134   BILIRUBINUR MODERATE (A) 11/03/2020 1134   KETONESUR NEGATIVE 11/03/2020 1134   PROTEINUR NEGATIVE 11/03/2020 1134   UROBILINOGEN 1.0 12/15/2014 0942   NITRITE NEGATIVE 11/03/2020 1134   LEUKOCYTESUR TRACE (A) 11/03/2020 1134   Sepsis Labs Invalid input(s): PROCALCITONIN,  WBC,  LACTICIDVEN Microbiology Recent Results (from the past 240 hour(s))  Resp Panel by RT-PCR (Flu A&B, Covid) Nasopharyngeal Swab     Status: None   Collection Time: 11/03/20  4:36 PM   Specimen: Nasopharyngeal Swab; Nasopharyngeal(NP) swabs in vial transport medium  Result Value Ref Range Status   SARS Coronavirus 2 by RT PCR NEGATIVE NEGATIVE Final    Comment: (NOTE) SARS-CoV-2 target nucleic acids are NOT DETECTED.  The SARS-CoV-2 RNA is generally detectable in upper respiratory specimens during the acute phase of infection. The lowest concentration of SARS-CoV-2 viral copies this assay can detect is 138 copies/mL. A negative result does not preclude SARS-Cov-2 infection and should not be used as the sole basis for treatment or other patient management decisions. A negative result may occur with  improper specimen collection/handling, submission of specimen other than nasopharyngeal swab, presence of viral mutation(s) within the areas targeted by this assay, and inadequate number of viral copies(<138 copies/mL). A negative result must be combined with clinical observations, patient history, and epidemiological information. The expected result is Negative.  Fact Sheet for Patients:  EntrepreneurPulse.com.au  Fact Sheet for Healthcare Providers:   IncredibleEmployment.be  This test is no t yet approved or cleared by the [place] FDA and  has been authorized for detection and/or diagnosis of SARS-CoV-2 by FDA under an Emergency Use Authorization (EUA). This EUA will remain  in effect (meaning this test can be used) for the duration of the COVID-19 declaration under Section 564(b)(1) of the Act, 21 U.S.C.section 360bbb-3(b)(1), unless the authorization is terminated  or revoked sooner.       Influenza A by PCR NEGATIVE NEGATIVE Final   Influenza B by PCR NEGATIVE NEGATIVE Final    Comment: (NOTE) The Xpert Xpress SARS-CoV-2/FLU/RSV plus assay is intended as an aid in the diagnosis of influenza from Nasopharyngeal swab specimens and should not be used as a sole basis for treatment. Nasal washings and aspirates are unacceptable for Xpert Xpress SARS-CoV-2/FLU/RSV testing.  Fact Sheet for Patients: EntrepreneurPulse.com.au  Fact Sheet for Healthcare Providers: IncredibleEmployment.be  This test is not yet approved or cleared by the [place] FDA and has been authorized for detection and/or diagnosis of SARS-CoV-2 by FDA under an Emergency Use Authorization (EUA). This EUA will remain in effect (meaning this test can be used) for the duration of the COVID-19 declaration under Section 564(b)(1) of the Act, 21 U.S.C. section 360bbb-3(b)(1), unless the authorization is terminated or revoked.  Performed at [facility], [address] [place], [place] [address]   Culture, blood (routine x 2)     Status: None (Preliminary result)   Collection Time: 11/03/20 10:39 PM   Specimen: BLOOD  Result Value Ref Range Status   Specimen Description   Final    BLOOD LEFT WRIST Performed at [facility] [address] [place], [place] [address]    Special Requests   Final    BOTTLES DRAWN  AEROBIC ONLY Blood Culture adequate volume Performed  at [facility] [address], [place], [place] [address]    Culture   Final    NO GROWTH 3 DAYS Performed at [facility], [place] [address] [place], [place] [address]    Report Status PENDING  Incomplete  Culture, blood (routine x 2)     Status: None (Preliminary result)   Collection Time: 11/03/20 10:39 PM   Specimen: BLOOD  Result Value Ref Range Status   Specimen Description   Final    BLOOD RIGHT WRIST Performed at [facility] [address] [place], [place] [address]    Special Requests   Final    BOTTLES DRAWN AEROBIC ONLY Blood Culture adequate volume Performed at [facility] [address], [place], [place] [address]    Culture   Final    NO GROWTH 3 DAYS Performed at [facility], [place] [address], [place], [place] [address]    Report Status PENDING  Incomplete    Procedures/Studies: DG Chest 2 View  Result Date: 11/03/2020 CLINICAL DATA:  Chest/epigastric pain. EXAM: CHEST - 2 VIEW COMPARISON:  March 04, 2010. FINDINGS: Borderline enlarged cardiac silhouette. Tortuous aorta. Low lung volumes. Both lungs are clear. No visible pleural effusions or pneumothorax. No acute osseous abnormality. IMPRESSION: No active cardiopulmonary disease. Electronically Signed   By: Margaretha Sheffield MD   On: 11/03/2020 11:19   DG Cholangiogram Operative  Result Date: 11/04/2020 CLINICAL DATA:  Laparoscopic cholecystectomy with intraoperative cholangiogram. EXAM: INTRAOPERATIVE CHOLANGIOGRAM TECHNIQUE: Cholangiographic images from the C-arm fluoroscopic device were submitted for interpretation post-operatively. Please see the procedural report for the amount of contrast and the fluoroscopy time utilized. COMPARISON:  CT abdomen/pelvis 11/03/2020 FINDINGS: Saved cine loops submitted for review. The images demonstrate cannulation of the cystic duct remanent with intraoperative cholangiogram. No evidence of stenosis,  stricture, or choledocholithiasis. Contrast material passes freely through the ampulla and into the duodenum. Slight reflux into the main pancreatic duct. IMPRESSION: Negative intraoperative cholangiogram. Electronically Signed   By: Jacqulynn Cadet M.D.   On: 11/04/2020 15:06   CT Angio Chest PE W and/or Wo Contrast  Result Date: 11/03/2020 CLINICAL DATA:  Epigastric pain, transaminitis. Shortness of breath. EXAM: CT ANGIOGRAPHY CHEST WITH CONTRAST TECHNIQUE: Multidetector CT imaging of the chest was performed using the standard protocol during bolus administration of intravenous contrast. Multiplanar CT image reconstructions and MIPs were obtained to evaluate the vascular anatomy. CONTRAST:  157mL OMNIPAQUE IOHEXOL 350 MG/ML SOLN COMPARISON:  None. FINDINGS: Cardiovascular: No filling defects in the pulmonary arteries to suggest pulmonary emboli. Heart is normal size. Calcifications throughout the left anterior descending coronary artery. Scattered aortic calcifications. No aneurysm. Mediastinum/Nodes: Small scattered and borderline sized mediastinal lymph nodes, likely reactive, none pathologically enlarged. Trachea and esophagus are unremarkable. Thyroid unremarkable. Lungs/Pleura: Linear dependent densities in the lower lobes compatible with atelectasis. No effusions. No confluent opacities or suspicious pulmonary nodules. Upper Abdomen: Gallbladder moderately distended. Musculoskeletal: Chest wall soft tissues are unremarkable. No acute bony abnormality. Review of the MIP images confirms the above findings. IMPRESSION: No evidence of pulmonary embolus. Dependent atelectasis in the lower lobes. Coronary artery disease. Gallbladder distension.  See abdominal CT report today. Aortic Atherosclerosis (ICD10-I70.0). Electronically Signed   By: Rolm Baptise M.D.   On: 11/03/2020 14:59   CT ABDOMEN PELVIS W CONTRAST  Result Date: 11/03/2020 CLINICAL DATA:  Epigastric pain, transaminitis.  Recent colonoscopy.  EXAM: CT ABDOMEN AND PELVIS WITH  CONTRAST TECHNIQUE: Multidetector CT imaging of the abdomen and pelvis was performed using the standard protocol following bolus administration of intravenous contrast. CONTRAST:  173mL OMNIPAQUE IOHEXOL 350 MG/ML SOLN COMPARISON:  04/07/2010 FINDINGS: Lower chest: Dependent atelectasis in the lung bases.  No effusions. Hepatobiliary: Gallbladder is distended. Possible slight surrounding stranding or fluid. No focal hepatic abnormality. No biliary ductal dilatation. Pancreas: No focal abnormality or ductal dilatation. Spleen: No focal abnormality.  Normal size. Adrenals/Urinary Tract: No adrenal abnormality. No focal renal abnormality. No stones or hydronephrosis. Urinary bladder is unremarkable. Stomach/Bowel: Postoperative changes in the sigmoid colon. Normal appendix. Stomach and small bowel decompressed, unremarkable. Vascular/Lymphatic: Aortic calcifications. No evidence of aneurysm or adenopathy. Reproductive: No visible focal abnormality. Other: No free fluid or free air. Small bilateral inguinal hernias and umbilical hernia containing fat. Musculoskeletal: No acute bony abnormality. IMPRESSION: Mild distention of the gallbladder with possible slight surrounding stranding or fluid. Consider further evaluation with right upper quadrant ultrasound. No free air following colonoscopy. Umbilical and bilateral inguinal hernias containing fat. Aortic atherosclerosis. Electronically Signed   By: Rolm Baptise M.D.   On: 11/03/2020 15:02   US Abdomen Limited  Result Date: 11/03/2020 CLINICAL DATA:  ruq pain, trasnaminitis, elevated bili EXAM: ULTRASOUND ABDOMEN LIMITED RIGHT UPPER QUADRANT COMPARISON:  04/07/2010 and prior. FINDINGS: Gallbladder: No gallstones or wall thickening visualized. No sonographic Fletes sign noted by sonographer. Common bile duct: Diameter: 5.3 mm Liver: No focal lesion identified. Increased parenchymal echogenicity. Portal vein is patent on color Doppler  imaging with normal direction of blood flow towards the liver. Other: None. IMPRESSION: Hepatic steatosis. Otherwise unremarkable right upper quadrant ultrasound. Electronically Signed   By: Primitivo Gauze M.D.   On: 11/03/2020 12:57     Time coordinating discharge: Over 30 minutes    Lawrence Dee, MD  [facility] 11/07/2020, 2:03 PM

## 2020-11-09 LAB — CULTURE, BLOOD (ROUTINE X 2)
Culture: NO GROWTH
Culture: NO GROWTH
Special Requests: ADEQUATE
Special Requests: ADEQUATE

## 2020-11-10 DIAGNOSIS — R945 Abnormal results of liver function studies: Secondary | ICD-10-CM | POA: Diagnosis not present

## 2020-11-21 DIAGNOSIS — R946 Abnormal results of thyroid function studies: Secondary | ICD-10-CM | POA: Diagnosis not present

## 2020-11-21 DIAGNOSIS — R7989 Other specified abnormal findings of blood chemistry: Secondary | ICD-10-CM | POA: Diagnosis not present

## 2020-11-21 DIAGNOSIS — Z9049 Acquired absence of other specified parts of digestive tract: Secondary | ICD-10-CM | POA: Diagnosis not present

## 2020-12-22 DIAGNOSIS — R946 Abnormal results of thyroid function studies: Secondary | ICD-10-CM | POA: Diagnosis not present

## 2020-12-27 IMAGING — CT CT ANGIO CHEST
2 of 8 series · 19 of 36 positions shown · IV contrast (Omnipaque)
Comparison: None.

CLINICAL DATA: Epigastric pain, transaminitis. Shortness of breath.

EXAM:
CT ANGIOGRAPHY CHEST WITH CONTRAST
TECHNIQUE: Multidetector CT imaging of the chest was performed using the
standard protocol during bolus administration of intravenous
contrast. Multiplanar CT image reconstructions and MIPs were
obtained to evaluate the vascular anatomy.
CONTRAST:  100mL OMNIPAQUE IOHEXOL 350 MG/ML SOLN

[Series 4: pe thins · axial · 0.74mm/px · z∈[-296,-28]mm · 18 of 301 slices shown]
[im 16/301  lung]
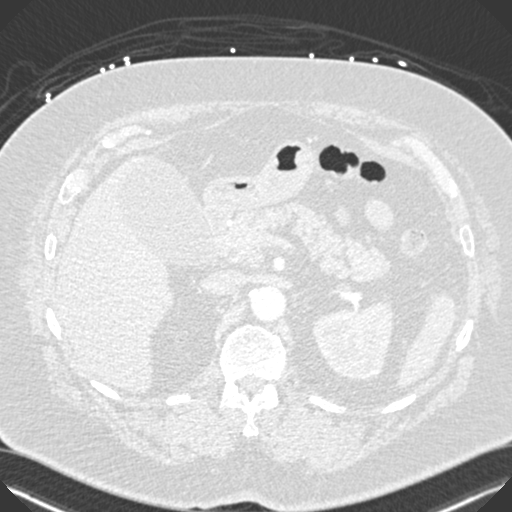
[im 32/301  mediastinal]
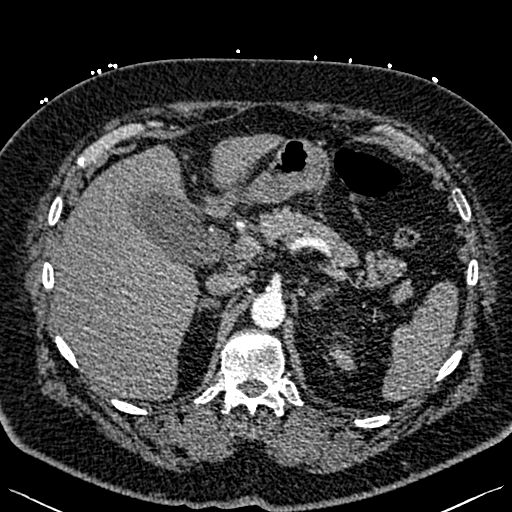
[im 48/301  lung]
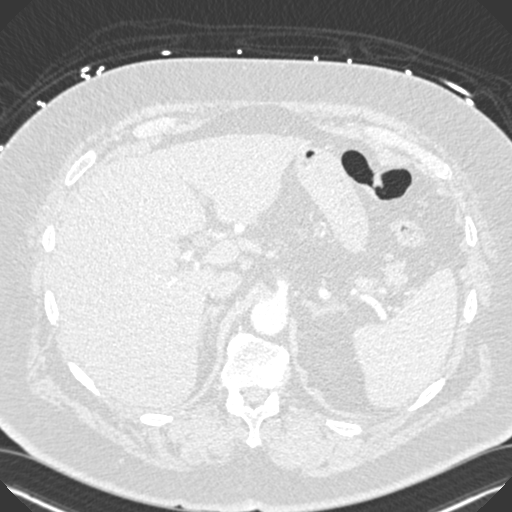
[im 64/301  mediastinal]
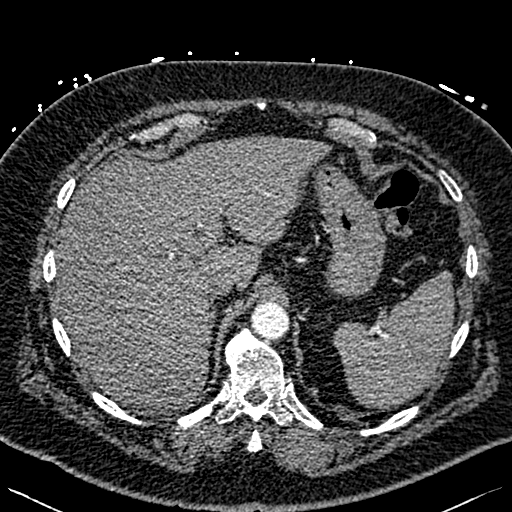
[im 79/301  lung]
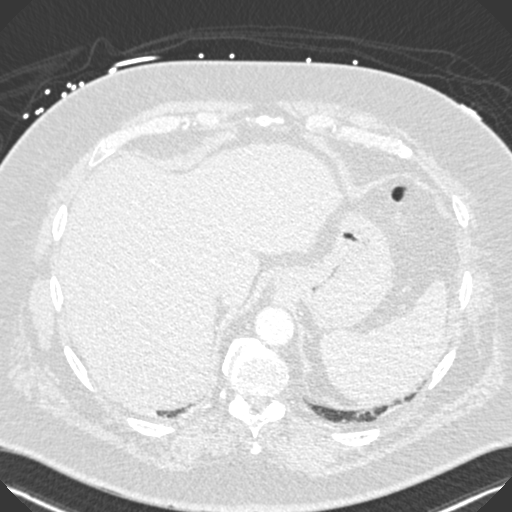
[im 95/301  mediastinal]
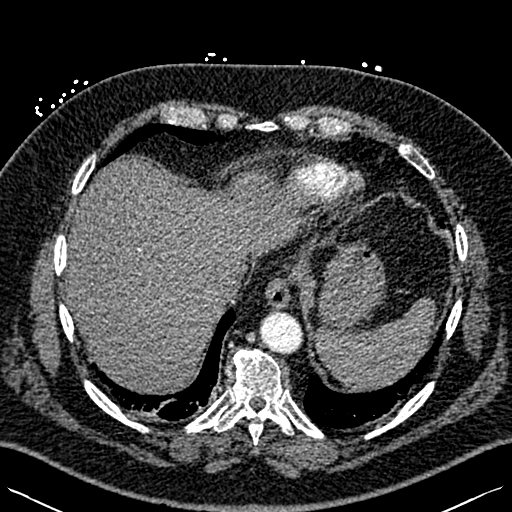
[im 111/301  lung]
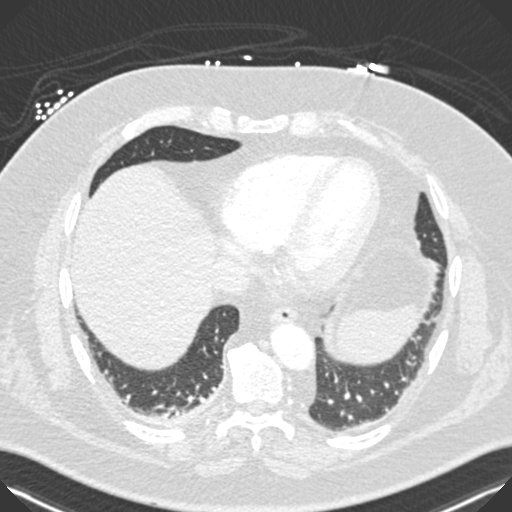
[im 127/301  mediastinal]
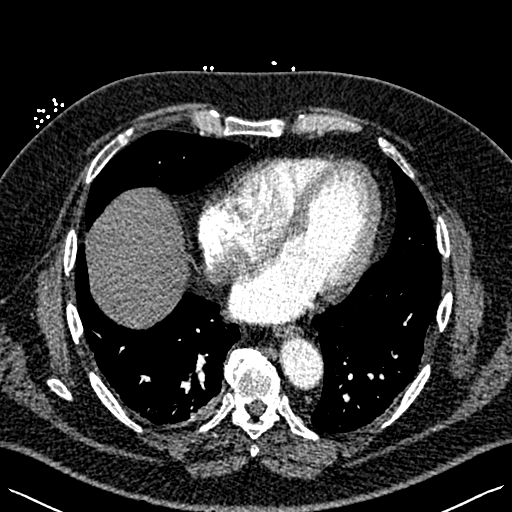
[im 143/301  lung]
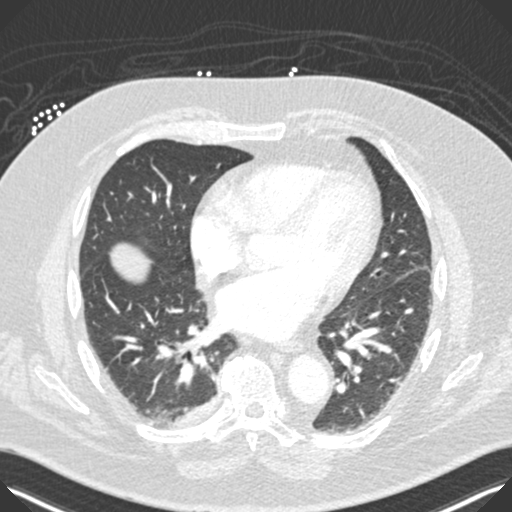
[im 158/301  mediastinal]
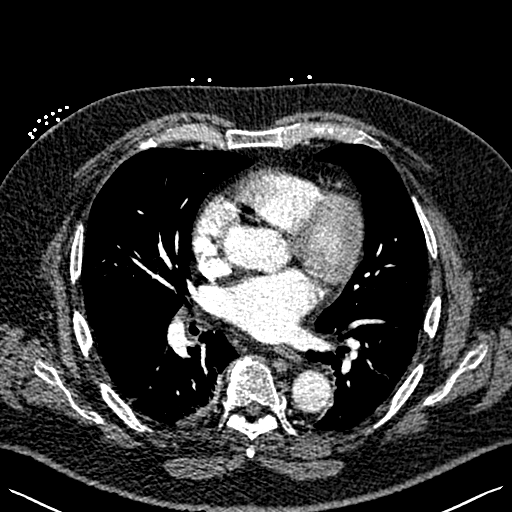
[im 174/301  lung]
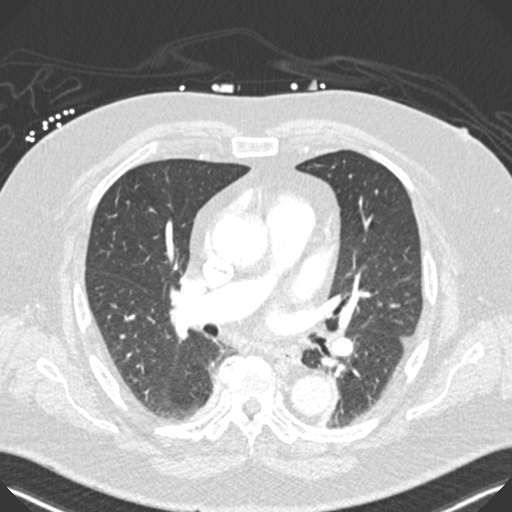
[im 190/301  mediastinal]
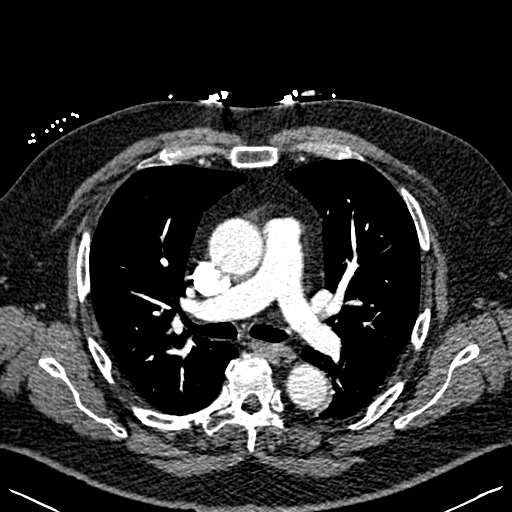
[im 206/301  lung]
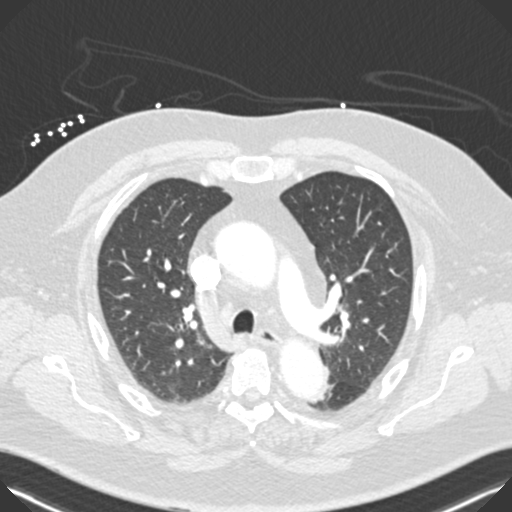
[im 222/301  mediastinal]
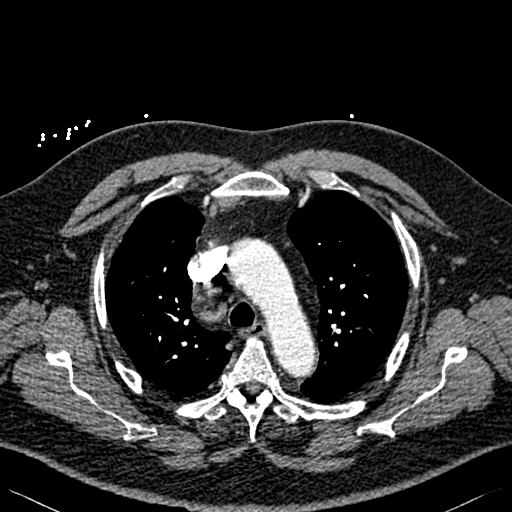
[im 237/301  lung]
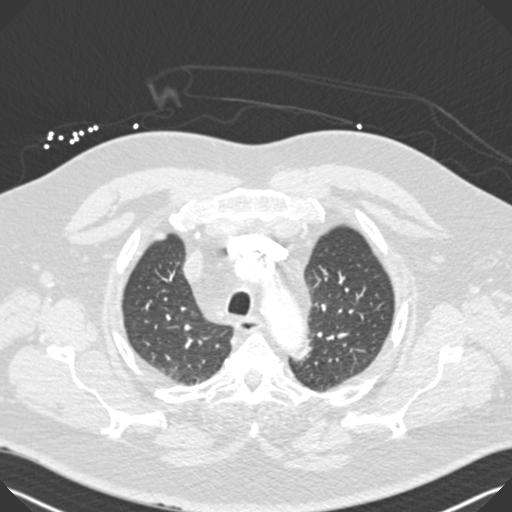
[im 253/301  mediastinal]
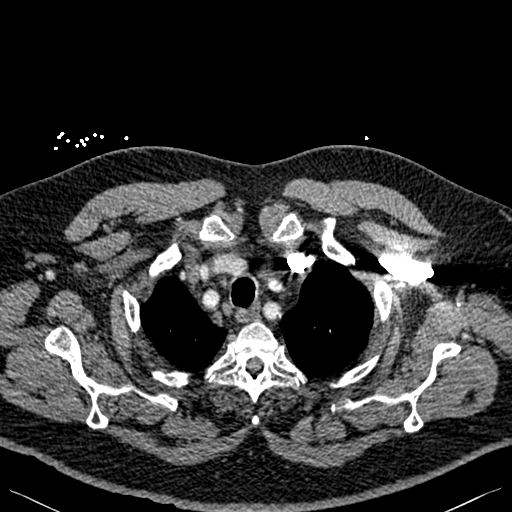
[im 269/301  lung]
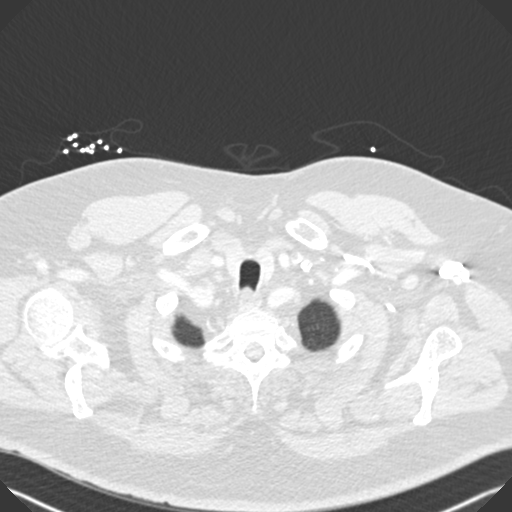
[im 285/301  mediastinal]
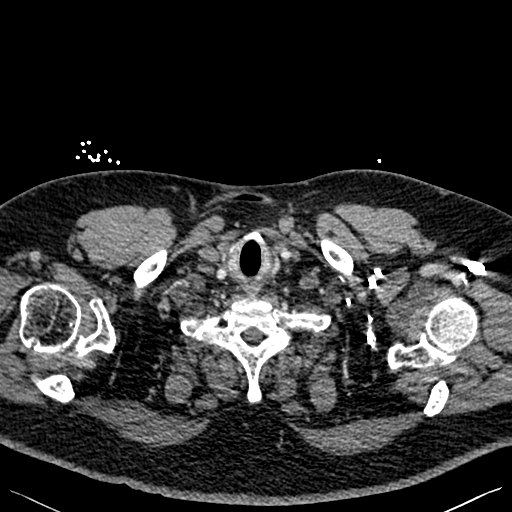

[Series 5: pe coronal mpr · coronal · 0.60mm/px · 1 of 143 slices shown]
[im 72/143  mediastinal]
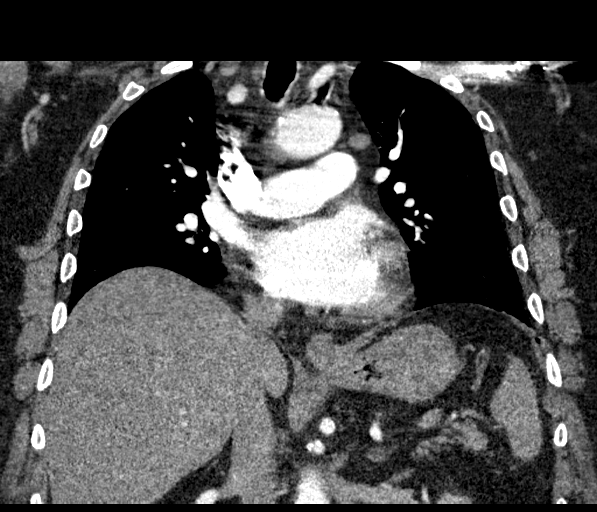

[19 of 36 positions shown; findings below may reference images not displayed]

FINDINGS: Cardiovascular: No filling defects in the pulmonary arteries to
suggest pulmonary emboli. Heart is normal size. Calcifications
throughout the left anterior descending coronary artery. Scattered
aortic calcifications. No aneurysm.

Mediastinum/Nodes: Small scattered and borderline sized mediastinal
lymph nodes, likely reactive, none pathologically enlarged. Trachea
and esophagus are unremarkable. Thyroid unremarkable.

Lungs/Pleura: Linear dependent densities in the lower lobes
compatible with atelectasis. No effusions. No confluent opacities or
suspicious pulmonary nodules.

Upper Abdomen: Gallbladder moderately distended.

Musculoskeletal: Chest wall soft tissues are unremarkable. No acute
bony abnormality.

Review of the MIP images confirms the above findings.
IMPRESSION: No evidence of pulmonary embolus.

Dependent atelectasis in the lower lobes.

Coronary artery disease.

Gallbladder distension.  See abdominal CT report today.

Aortic Atherosclerosis ([id number]).

## 2020-12-27 IMAGING — CR DG CHEST 2V
2 series · 2 of 2 positions shown · non-contrast
Comparison: March 04, 2010.

CLINICAL DATA: Chest/epigastric pain.

EXAM:
CHEST - 2 VIEW

[w chest pa]
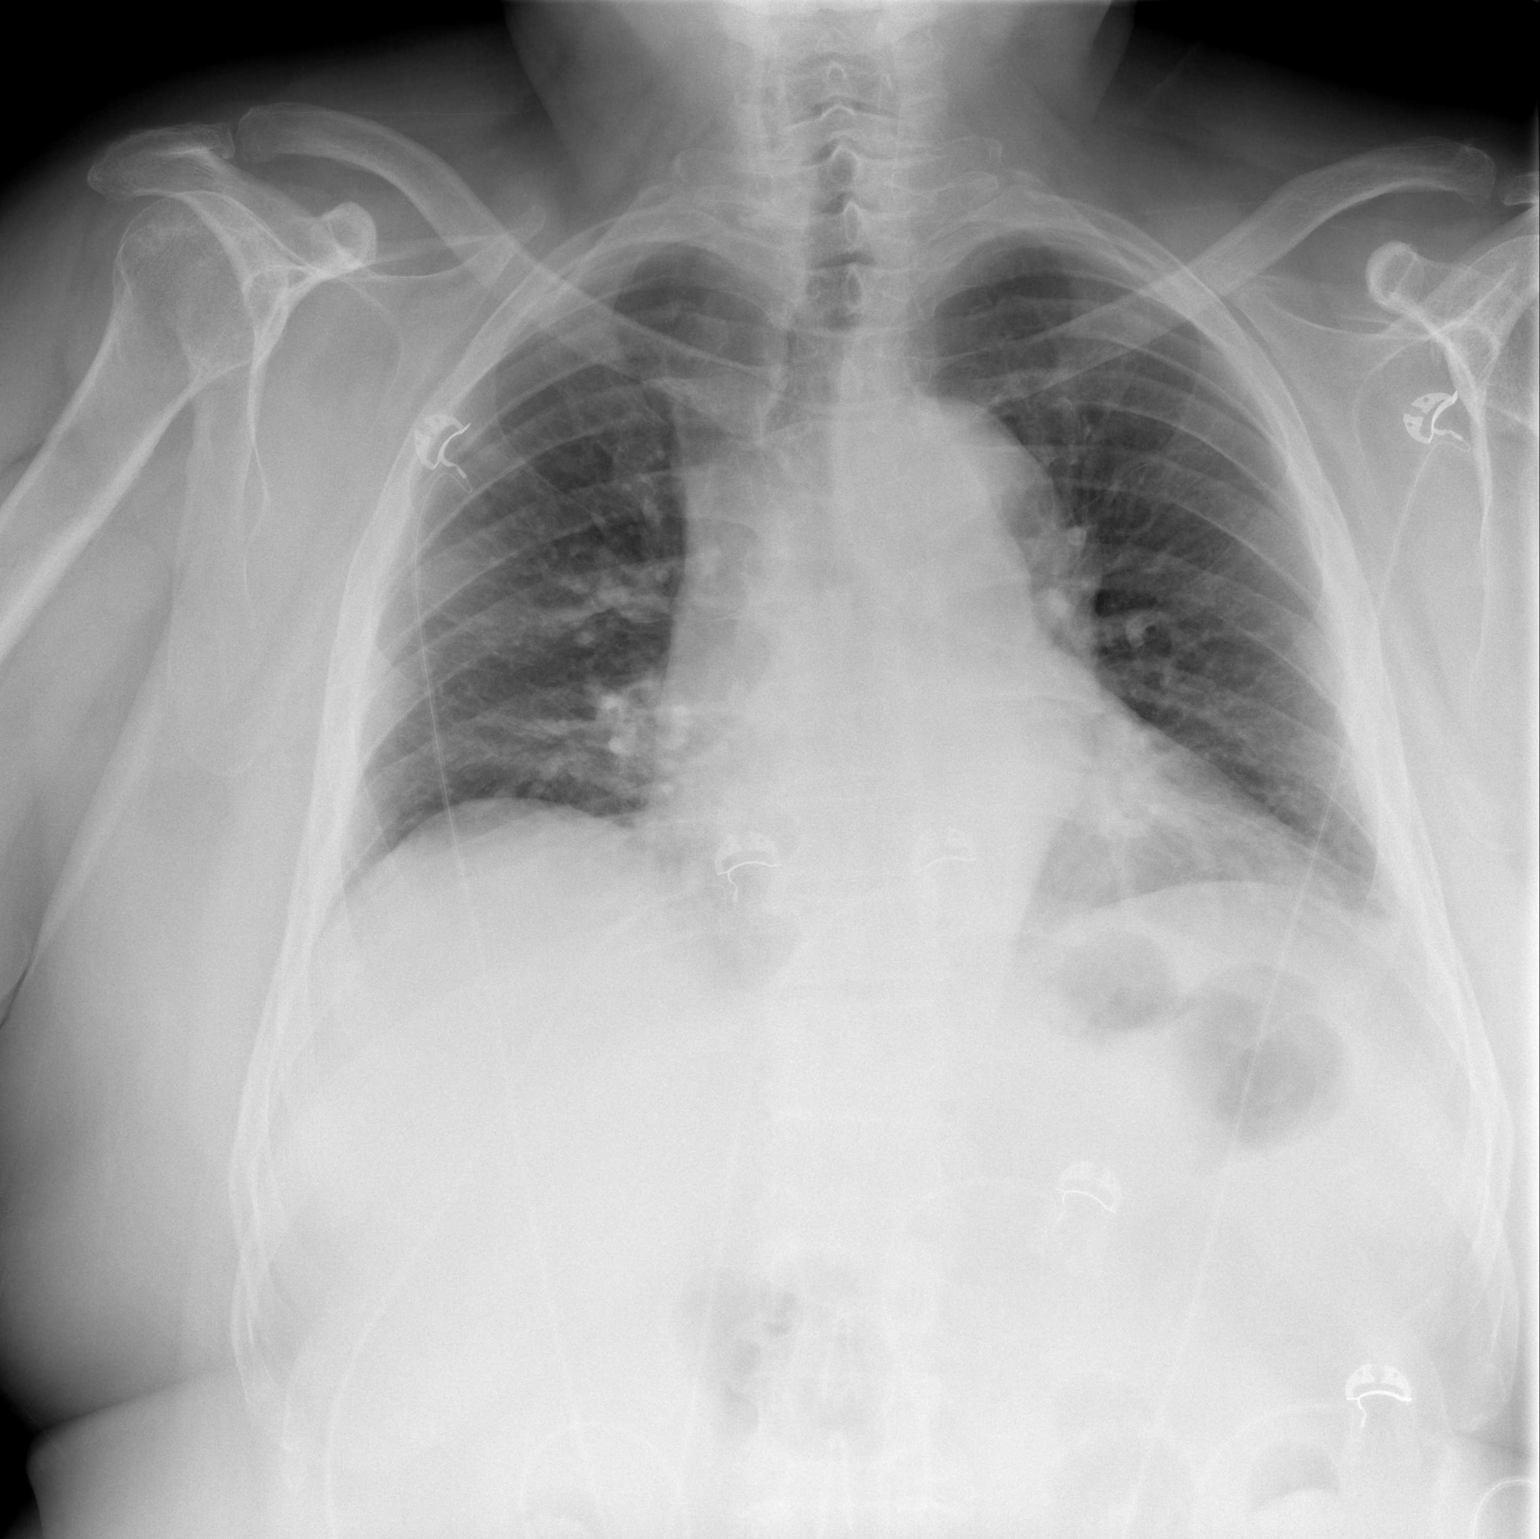

[w chest lat]
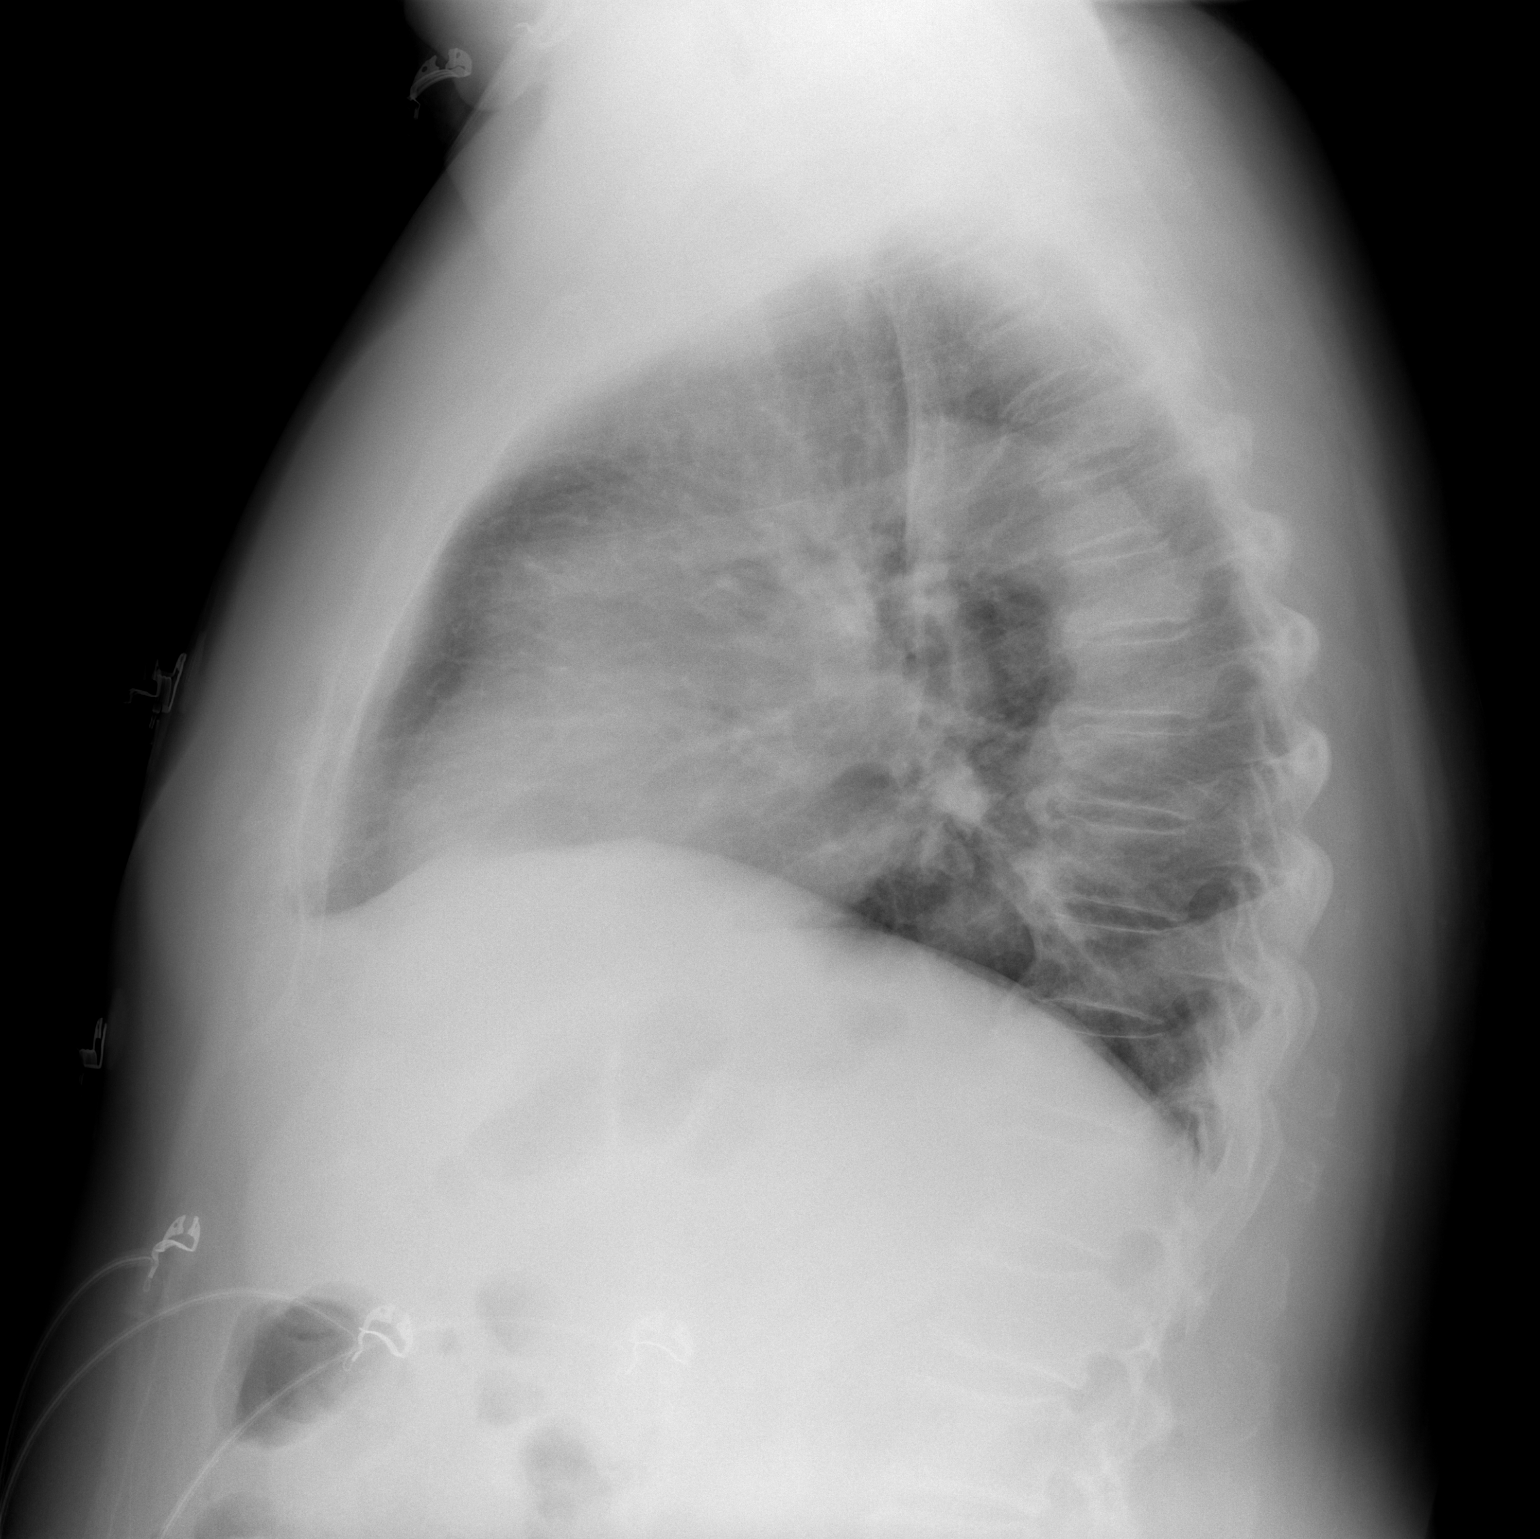

[2 of 2 positions shown; findings below may reference images not displayed]

FINDINGS: Borderline enlarged cardiac silhouette. Tortuous aorta. Low lung
volumes. Both lungs are clear. No visible pleural effusions or
pneumothorax. No acute osseous abnormality.
IMPRESSION: No active cardiopulmonary disease.

## 2021-02-20 DIAGNOSIS — K219 Gastro-esophageal reflux disease without esophagitis: Secondary | ICD-10-CM | POA: Diagnosis not present

## 2021-02-20 DIAGNOSIS — M170 Bilateral primary osteoarthritis of knee: Secondary | ICD-10-CM | POA: Diagnosis not present

## 2021-02-20 DIAGNOSIS — E7800 Pure hypercholesterolemia, unspecified: Secondary | ICD-10-CM | POA: Diagnosis not present

## 2021-02-20 DIAGNOSIS — I10 Essential (primary) hypertension: Secondary | ICD-10-CM | POA: Diagnosis not present

## 2021-02-20 DIAGNOSIS — M179 Osteoarthritis of knee, unspecified: Secondary | ICD-10-CM | POA: Diagnosis not present

## 2021-03-06 DIAGNOSIS — I10 Essential (primary) hypertension: Secondary | ICD-10-CM | POA: Diagnosis not present

## 2021-03-06 DIAGNOSIS — R7303 Prediabetes: Secondary | ICD-10-CM | POA: Diagnosis not present

## 2021-03-06 DIAGNOSIS — Z125 Encounter for screening for malignant neoplasm of prostate: Secondary | ICD-10-CM | POA: Diagnosis not present

## 2021-03-06 DIAGNOSIS — Z0000 Encounter for general adult medical examination without abnormal findings: Secondary | ICD-10-CM | POA: Diagnosis not present

## 2021-03-06 DIAGNOSIS — E7800 Pure hypercholesterolemia, unspecified: Secondary | ICD-10-CM | POA: Diagnosis not present

## 2021-05-14 DIAGNOSIS — R197 Diarrhea, unspecified: Secondary | ICD-10-CM | POA: Diagnosis not present

## 2021-05-14 DIAGNOSIS — R350 Frequency of micturition: Secondary | ICD-10-CM | POA: Diagnosis not present

## 2021-05-14 DIAGNOSIS — R109 Unspecified abdominal pain: Secondary | ICD-10-CM | POA: Diagnosis not present

## 2021-05-14 DIAGNOSIS — R110 Nausea: Secondary | ICD-10-CM | POA: Diagnosis not present

## 2021-05-19 DIAGNOSIS — M179 Osteoarthritis of knee, unspecified: Secondary | ICD-10-CM | POA: Diagnosis not present

## 2021-05-19 DIAGNOSIS — E7800 Pure hypercholesterolemia, unspecified: Secondary | ICD-10-CM | POA: Diagnosis not present

## 2021-05-19 DIAGNOSIS — I10 Essential (primary) hypertension: Secondary | ICD-10-CM | POA: Diagnosis not present

## 2021-05-19 DIAGNOSIS — K219 Gastro-esophageal reflux disease without esophagitis: Secondary | ICD-10-CM | POA: Diagnosis not present

## 2021-05-19 DIAGNOSIS — M170 Bilateral primary osteoarthritis of knee: Secondary | ICD-10-CM | POA: Diagnosis not present

## 2021-08-01 DIAGNOSIS — K219 Gastro-esophageal reflux disease without esophagitis: Secondary | ICD-10-CM | POA: Diagnosis not present

## 2021-08-01 DIAGNOSIS — I10 Essential (primary) hypertension: Secondary | ICD-10-CM | POA: Diagnosis not present

## 2021-08-01 DIAGNOSIS — M179 Osteoarthritis of knee, unspecified: Secondary | ICD-10-CM | POA: Diagnosis not present

## 2021-08-01 DIAGNOSIS — E7800 Pure hypercholesterolemia, unspecified: Secondary | ICD-10-CM | POA: Diagnosis not present

## 2021-08-01 DIAGNOSIS — M170 Bilateral primary osteoarthritis of knee: Secondary | ICD-10-CM | POA: Diagnosis not present

## 2021-09-14 DIAGNOSIS — E7800 Pure hypercholesterolemia, unspecified: Secondary | ICD-10-CM | POA: Diagnosis not present

## 2021-09-14 DIAGNOSIS — I10 Essential (primary) hypertension: Secondary | ICD-10-CM | POA: Diagnosis not present

## 2021-09-14 DIAGNOSIS — M170 Bilateral primary osteoarthritis of knee: Secondary | ICD-10-CM | POA: Diagnosis not present

## 2021-09-14 DIAGNOSIS — M179 Osteoarthritis of knee, unspecified: Secondary | ICD-10-CM | POA: Diagnosis not present

## 2021-09-14 DIAGNOSIS — K219 Gastro-esophageal reflux disease without esophagitis: Secondary | ICD-10-CM | POA: Diagnosis not present

## 2021-09-29 DIAGNOSIS — E7800 Pure hypercholesterolemia, unspecified: Secondary | ICD-10-CM | POA: Diagnosis not present

## 2021-09-29 DIAGNOSIS — Z23 Encounter for immunization: Secondary | ICD-10-CM | POA: Diagnosis not present

## 2021-09-29 DIAGNOSIS — R7303 Prediabetes: Secondary | ICD-10-CM | POA: Diagnosis not present

## 2021-09-29 DIAGNOSIS — M170 Bilateral primary osteoarthritis of knee: Secondary | ICD-10-CM | POA: Diagnosis not present

## 2021-09-29 DIAGNOSIS — K219 Gastro-esophageal reflux disease without esophagitis: Secondary | ICD-10-CM | POA: Diagnosis not present

## 2021-09-29 DIAGNOSIS — I10 Essential (primary) hypertension: Secondary | ICD-10-CM | POA: Diagnosis not present

## 2021-10-11 DIAGNOSIS — H5203 Hypermetropia, bilateral: Secondary | ICD-10-CM | POA: Diagnosis not present

## 2021-10-11 DIAGNOSIS — H35371 Puckering of macula, right eye: Secondary | ICD-10-CM | POA: Diagnosis not present

## 2021-10-11 DIAGNOSIS — H2513 Age-related nuclear cataract, bilateral: Secondary | ICD-10-CM | POA: Diagnosis not present

## 2021-10-11 DIAGNOSIS — H43813 Vitreous degeneration, bilateral: Secondary | ICD-10-CM | POA: Diagnosis not present

## 2021-11-02 DIAGNOSIS — H25011 Cortical age-related cataract, right eye: Secondary | ICD-10-CM | POA: Diagnosis not present

## 2021-11-02 DIAGNOSIS — H25811 Combined forms of age-related cataract, right eye: Secondary | ICD-10-CM | POA: Diagnosis not present

## 2021-11-02 DIAGNOSIS — H2511 Age-related nuclear cataract, right eye: Secondary | ICD-10-CM | POA: Diagnosis not present

## 2021-11-11 DIAGNOSIS — U071 COVID-19: Secondary | ICD-10-CM | POA: Diagnosis not present

## 2021-12-01 DIAGNOSIS — Z961 Presence of intraocular lens: Secondary | ICD-10-CM | POA: Diagnosis not present

## 2022-02-20 DIAGNOSIS — R1013 Epigastric pain: Secondary | ICD-10-CM | POA: Diagnosis not present

## 2022-02-20 DIAGNOSIS — K219 Gastro-esophageal reflux disease without esophagitis: Secondary | ICD-10-CM | POA: Diagnosis not present

## 2022-02-20 DIAGNOSIS — R198 Other specified symptoms and signs involving the digestive system and abdomen: Secondary | ICD-10-CM | POA: Diagnosis not present

## 2022-03-16 DIAGNOSIS — R7303 Prediabetes: Secondary | ICD-10-CM | POA: Diagnosis not present

## 2022-03-16 DIAGNOSIS — M170 Bilateral primary osteoarthritis of knee: Secondary | ICD-10-CM | POA: Diagnosis not present

## 2022-03-16 DIAGNOSIS — E7800 Pure hypercholesterolemia, unspecified: Secondary | ICD-10-CM | POA: Diagnosis not present

## 2022-03-16 DIAGNOSIS — Z9049 Acquired absence of other specified parts of digestive tract: Secondary | ICD-10-CM | POA: Diagnosis not present

## 2022-03-16 DIAGNOSIS — I10 Essential (primary) hypertension: Secondary | ICD-10-CM | POA: Diagnosis not present

## 2022-03-16 DIAGNOSIS — Z0000 Encounter for general adult medical examination without abnormal findings: Secondary | ICD-10-CM | POA: Diagnosis not present

## 2022-03-16 DIAGNOSIS — K760 Fatty (change of) liver, not elsewhere classified: Secondary | ICD-10-CM | POA: Diagnosis not present

## 2022-03-16 DIAGNOSIS — K219 Gastro-esophageal reflux disease without esophagitis: Secondary | ICD-10-CM | POA: Diagnosis not present

## 2022-03-16 DIAGNOSIS — Z125 Encounter for screening for malignant neoplasm of prostate: Secondary | ICD-10-CM | POA: Diagnosis not present

## 2022-03-16 DIAGNOSIS — R945 Abnormal results of liver function studies: Secondary | ICD-10-CM | POA: Diagnosis not present

## 2022-03-23 DIAGNOSIS — K219 Gastro-esophageal reflux disease without esophagitis: Secondary | ICD-10-CM | POA: Diagnosis not present

## 2022-03-23 DIAGNOSIS — R1013 Epigastric pain: Secondary | ICD-10-CM | POA: Diagnosis not present

## 2022-03-23 DIAGNOSIS — K760 Fatty (change of) liver, not elsewhere classified: Secondary | ICD-10-CM | POA: Diagnosis not present

## 2022-04-16 DIAGNOSIS — M25512 Pain in left shoulder: Secondary | ICD-10-CM | POA: Diagnosis not present

## 2022-05-23 DIAGNOSIS — M5127 Other intervertebral disc displacement, lumbosacral region: Secondary | ICD-10-CM | POA: Diagnosis not present

## 2022-05-25 DIAGNOSIS — K219 Gastro-esophageal reflux disease without esophagitis: Secondary | ICD-10-CM | POA: Diagnosis not present

## 2022-05-25 DIAGNOSIS — R110 Nausea: Secondary | ICD-10-CM | POA: Diagnosis not present

## 2022-06-11 DIAGNOSIS — M5116 Intervertebral disc disorders with radiculopathy, lumbar region: Secondary | ICD-10-CM | POA: Diagnosis not present

## 2022-06-11 DIAGNOSIS — M5416 Radiculopathy, lumbar region: Secondary | ICD-10-CM | POA: Diagnosis not present

## 2022-06-12 DIAGNOSIS — I10 Essential (primary) hypertension: Secondary | ICD-10-CM | POA: Diagnosis not present

## 2022-06-12 DIAGNOSIS — E7800 Pure hypercholesterolemia, unspecified: Secondary | ICD-10-CM | POA: Diagnosis not present

## 2022-06-12 DIAGNOSIS — K219 Gastro-esophageal reflux disease without esophagitis: Secondary | ICD-10-CM | POA: Diagnosis not present

## 2022-06-12 DIAGNOSIS — R7303 Prediabetes: Secondary | ICD-10-CM | POA: Diagnosis not present

## 2022-06-12 DIAGNOSIS — K760 Fatty (change of) liver, not elsewhere classified: Secondary | ICD-10-CM | POA: Diagnosis not present

## 2022-06-12 DIAGNOSIS — Z6841 Body Mass Index (BMI) 40.0 and over, adult: Secondary | ICD-10-CM | POA: Diagnosis not present

## 2022-07-24 DIAGNOSIS — K760 Fatty (change of) liver, not elsewhere classified: Secondary | ICD-10-CM | POA: Diagnosis not present

## 2022-07-24 DIAGNOSIS — K219 Gastro-esophageal reflux disease without esophagitis: Secondary | ICD-10-CM | POA: Diagnosis not present

## 2022-07-24 DIAGNOSIS — E7800 Pure hypercholesterolemia, unspecified: Secondary | ICD-10-CM | POA: Diagnosis not present

## 2022-07-24 DIAGNOSIS — R7303 Prediabetes: Secondary | ICD-10-CM | POA: Diagnosis not present

## 2022-07-24 DIAGNOSIS — I10 Essential (primary) hypertension: Secondary | ICD-10-CM | POA: Diagnosis not present

## 2022-07-24 DIAGNOSIS — Z6838 Body mass index (BMI) 38.0-38.9, adult: Secondary | ICD-10-CM | POA: Diagnosis not present

## 2022-09-06 DIAGNOSIS — K760 Fatty (change of) liver, not elsewhere classified: Secondary | ICD-10-CM | POA: Diagnosis not present

## 2022-09-06 DIAGNOSIS — K219 Gastro-esophageal reflux disease without esophagitis: Secondary | ICD-10-CM | POA: Diagnosis not present

## 2022-09-06 DIAGNOSIS — R7303 Prediabetes: Secondary | ICD-10-CM | POA: Diagnosis not present

## 2022-09-06 DIAGNOSIS — E7800 Pure hypercholesterolemia, unspecified: Secondary | ICD-10-CM | POA: Diagnosis not present

## 2022-09-06 DIAGNOSIS — I10 Essential (primary) hypertension: Secondary | ICD-10-CM | POA: Diagnosis not present

## 2022-09-06 DIAGNOSIS — Z6838 Body mass index (BMI) 38.0-38.9, adult: Secondary | ICD-10-CM | POA: Diagnosis not present

## 2022-09-13 DIAGNOSIS — I10 Essential (primary) hypertension: Secondary | ICD-10-CM | POA: Diagnosis not present

## 2022-09-13 DIAGNOSIS — R7303 Prediabetes: Secondary | ICD-10-CM | POA: Diagnosis not present

## 2022-09-13 DIAGNOSIS — K760 Fatty (change of) liver, not elsewhere classified: Secondary | ICD-10-CM | POA: Diagnosis not present

## 2022-09-13 DIAGNOSIS — E7800 Pure hypercholesterolemia, unspecified: Secondary | ICD-10-CM | POA: Diagnosis not present

## 2022-09-13 DIAGNOSIS — K219 Gastro-esophageal reflux disease without esophagitis: Secondary | ICD-10-CM | POA: Diagnosis not present

## 2022-09-13 DIAGNOSIS — N3001 Acute cystitis with hematuria: Secondary | ICD-10-CM | POA: Diagnosis not present

## 2022-09-13 DIAGNOSIS — Z23 Encounter for immunization: Secondary | ICD-10-CM | POA: Diagnosis not present

## 2022-10-11 DIAGNOSIS — M25562 Pain in left knee: Secondary | ICD-10-CM | POA: Diagnosis not present

## 2022-10-11 DIAGNOSIS — G8929 Other chronic pain: Secondary | ICD-10-CM | POA: Diagnosis not present

## 2022-10-11 DIAGNOSIS — M1712 Unilateral primary osteoarthritis, left knee: Secondary | ICD-10-CM | POA: Diagnosis not present

## 2022-10-25 DIAGNOSIS — Z6838 Body mass index (BMI) 38.0-38.9, adult: Secondary | ICD-10-CM | POA: Diagnosis not present

## 2022-10-25 DIAGNOSIS — E7800 Pure hypercholesterolemia, unspecified: Secondary | ICD-10-CM | POA: Diagnosis not present

## 2022-10-25 DIAGNOSIS — K760 Fatty (change of) liver, not elsewhere classified: Secondary | ICD-10-CM | POA: Diagnosis not present

## 2022-10-25 DIAGNOSIS — K219 Gastro-esophageal reflux disease without esophagitis: Secondary | ICD-10-CM | POA: Diagnosis not present

## 2022-11-06 DIAGNOSIS — M25512 Pain in left shoulder: Secondary | ICD-10-CM | POA: Diagnosis not present

## 2022-11-06 DIAGNOSIS — S20212A Contusion of left front wall of thorax, initial encounter: Secondary | ICD-10-CM | POA: Diagnosis not present

## 2022-12-04 DIAGNOSIS — H52203 Unspecified astigmatism, bilateral: Secondary | ICD-10-CM | POA: Diagnosis not present

## 2022-12-04 DIAGNOSIS — H35371 Puckering of macula, right eye: Secondary | ICD-10-CM | POA: Diagnosis not present

## 2022-12-04 DIAGNOSIS — H524 Presbyopia: Secondary | ICD-10-CM | POA: Diagnosis not present

## 2022-12-04 DIAGNOSIS — H43813 Vitreous degeneration, bilateral: Secondary | ICD-10-CM | POA: Diagnosis not present

## 2022-12-04 DIAGNOSIS — H2512 Age-related nuclear cataract, left eye: Secondary | ICD-10-CM | POA: Diagnosis not present

## 2022-12-13 DIAGNOSIS — I10 Essential (primary) hypertension: Secondary | ICD-10-CM | POA: Diagnosis not present

## 2022-12-13 DIAGNOSIS — R7303 Prediabetes: Secondary | ICD-10-CM | POA: Diagnosis not present

## 2022-12-13 DIAGNOSIS — Z6838 Body mass index (BMI) 38.0-38.9, adult: Secondary | ICD-10-CM | POA: Diagnosis not present

## 2022-12-13 DIAGNOSIS — K760 Fatty (change of) liver, not elsewhere classified: Secondary | ICD-10-CM | POA: Diagnosis not present

## 2022-12-13 DIAGNOSIS — K219 Gastro-esophageal reflux disease without esophagitis: Secondary | ICD-10-CM | POA: Diagnosis not present

## 2022-12-13 DIAGNOSIS — E7800 Pure hypercholesterolemia, unspecified: Secondary | ICD-10-CM | POA: Diagnosis not present

## 2022-12-19 DIAGNOSIS — L601 Onycholysis: Secondary | ICD-10-CM | POA: Diagnosis not present

## 2022-12-26 DIAGNOSIS — Z419 Encounter for procedure for purposes other than remedying health state, unspecified: Secondary | ICD-10-CM | POA: Diagnosis not present

## 2023-01-21 DIAGNOSIS — G8918 Other acute postprocedural pain: Secondary | ICD-10-CM | POA: Diagnosis not present

## 2023-01-21 DIAGNOSIS — Z96652 Presence of left artificial knee joint: Secondary | ICD-10-CM | POA: Diagnosis not present

## 2023-01-21 DIAGNOSIS — M1712 Unilateral primary osteoarthritis, left knee: Secondary | ICD-10-CM | POA: Diagnosis not present

## 2023-01-29 DIAGNOSIS — Z96652 Presence of left artificial knee joint: Secondary | ICD-10-CM | POA: Diagnosis not present

## 2023-01-29 DIAGNOSIS — M1712 Unilateral primary osteoarthritis, left knee: Secondary | ICD-10-CM | POA: Diagnosis not present

## 2023-01-30 DIAGNOSIS — Z96652 Presence of left artificial knee joint: Secondary | ICD-10-CM | POA: Diagnosis not present

## 2023-01-30 DIAGNOSIS — M1712 Unilateral primary osteoarthritis, left knee: Secondary | ICD-10-CM | POA: Diagnosis not present

## 2023-01-31 DIAGNOSIS — R29898 Other symptoms and signs involving the musculoskeletal system: Secondary | ICD-10-CM | POA: Diagnosis not present

## 2023-01-31 DIAGNOSIS — M25662 Stiffness of left knee, not elsewhere classified: Secondary | ICD-10-CM | POA: Diagnosis not present

## 2023-01-31 DIAGNOSIS — M25562 Pain in left knee: Secondary | ICD-10-CM | POA: Diagnosis not present

## 2023-01-31 DIAGNOSIS — M25462 Effusion, left knee: Secondary | ICD-10-CM | POA: Diagnosis not present

## 2023-01-31 DIAGNOSIS — Z96652 Presence of left artificial knee joint: Secondary | ICD-10-CM | POA: Diagnosis not present

## 2023-01-31 DIAGNOSIS — Z7409 Other reduced mobility: Secondary | ICD-10-CM | POA: Diagnosis not present

## 2023-02-05 DIAGNOSIS — M25662 Stiffness of left knee, not elsewhere classified: Secondary | ICD-10-CM | POA: Diagnosis not present

## 2023-02-05 DIAGNOSIS — Z96652 Presence of left artificial knee joint: Secondary | ICD-10-CM | POA: Diagnosis not present

## 2023-02-05 DIAGNOSIS — Z7409 Other reduced mobility: Secondary | ICD-10-CM | POA: Diagnosis not present

## 2023-02-05 DIAGNOSIS — M25562 Pain in left knee: Secondary | ICD-10-CM | POA: Diagnosis not present

## 2023-02-05 DIAGNOSIS — M25462 Effusion, left knee: Secondary | ICD-10-CM | POA: Diagnosis not present

## 2023-02-05 DIAGNOSIS — R29898 Other symptoms and signs involving the musculoskeletal system: Secondary | ICD-10-CM | POA: Diagnosis not present

## 2023-02-08 DIAGNOSIS — M25562 Pain in left knee: Secondary | ICD-10-CM | POA: Diagnosis not present

## 2023-02-08 DIAGNOSIS — M25662 Stiffness of left knee, not elsewhere classified: Secondary | ICD-10-CM | POA: Diagnosis not present

## 2023-02-08 DIAGNOSIS — R29898 Other symptoms and signs involving the musculoskeletal system: Secondary | ICD-10-CM | POA: Diagnosis not present

## 2023-02-08 DIAGNOSIS — Z96652 Presence of left artificial knee joint: Secondary | ICD-10-CM | POA: Diagnosis not present

## 2023-02-08 DIAGNOSIS — M25462 Effusion, left knee: Secondary | ICD-10-CM | POA: Diagnosis not present

## 2023-02-08 DIAGNOSIS — Z7409 Other reduced mobility: Secondary | ICD-10-CM | POA: Diagnosis not present

## 2023-02-12 DIAGNOSIS — M25462 Effusion, left knee: Secondary | ICD-10-CM | POA: Diagnosis not present

## 2023-02-12 DIAGNOSIS — M25662 Stiffness of left knee, not elsewhere classified: Secondary | ICD-10-CM | POA: Diagnosis not present

## 2023-02-12 DIAGNOSIS — M25562 Pain in left knee: Secondary | ICD-10-CM | POA: Diagnosis not present

## 2023-02-12 DIAGNOSIS — Z96652 Presence of left artificial knee joint: Secondary | ICD-10-CM | POA: Diagnosis not present

## 2023-02-12 DIAGNOSIS — Z7409 Other reduced mobility: Secondary | ICD-10-CM | POA: Diagnosis not present

## 2023-02-12 DIAGNOSIS — R29898 Other symptoms and signs involving the musculoskeletal system: Secondary | ICD-10-CM | POA: Diagnosis not present

## 2023-02-15 DIAGNOSIS — M25562 Pain in left knee: Secondary | ICD-10-CM | POA: Diagnosis not present

## 2023-02-15 DIAGNOSIS — Z96652 Presence of left artificial knee joint: Secondary | ICD-10-CM | POA: Diagnosis not present

## 2023-02-15 DIAGNOSIS — R29898 Other symptoms and signs involving the musculoskeletal system: Secondary | ICD-10-CM | POA: Diagnosis not present

## 2023-02-15 DIAGNOSIS — M25462 Effusion, left knee: Secondary | ICD-10-CM | POA: Diagnosis not present

## 2023-02-15 DIAGNOSIS — M25662 Stiffness of left knee, not elsewhere classified: Secondary | ICD-10-CM | POA: Diagnosis not present

## 2023-02-15 DIAGNOSIS — Z7409 Other reduced mobility: Secondary | ICD-10-CM | POA: Diagnosis not present

## 2023-02-19 DIAGNOSIS — Z7409 Other reduced mobility: Secondary | ICD-10-CM | POA: Diagnosis not present

## 2023-02-19 DIAGNOSIS — R29898 Other symptoms and signs involving the musculoskeletal system: Secondary | ICD-10-CM | POA: Diagnosis not present

## 2023-02-19 DIAGNOSIS — M25562 Pain in left knee: Secondary | ICD-10-CM | POA: Diagnosis not present

## 2023-02-19 DIAGNOSIS — M25662 Stiffness of left knee, not elsewhere classified: Secondary | ICD-10-CM | POA: Diagnosis not present

## 2023-02-19 DIAGNOSIS — Z96652 Presence of left artificial knee joint: Secondary | ICD-10-CM | POA: Diagnosis not present

## 2023-02-19 DIAGNOSIS — M25462 Effusion, left knee: Secondary | ICD-10-CM | POA: Diagnosis not present

## 2023-02-22 DIAGNOSIS — M25662 Stiffness of left knee, not elsewhere classified: Secondary | ICD-10-CM | POA: Diagnosis not present

## 2023-02-22 DIAGNOSIS — R29898 Other symptoms and signs involving the musculoskeletal system: Secondary | ICD-10-CM | POA: Diagnosis not present

## 2023-02-22 DIAGNOSIS — M25562 Pain in left knee: Secondary | ICD-10-CM | POA: Diagnosis not present

## 2023-02-22 DIAGNOSIS — Z96652 Presence of left artificial knee joint: Secondary | ICD-10-CM | POA: Diagnosis not present

## 2023-02-22 DIAGNOSIS — Z7409 Other reduced mobility: Secondary | ICD-10-CM | POA: Diagnosis not present

## 2023-02-22 DIAGNOSIS — M25462 Effusion, left knee: Secondary | ICD-10-CM | POA: Diagnosis not present

## 2023-02-26 DIAGNOSIS — M25462 Effusion, left knee: Secondary | ICD-10-CM | POA: Diagnosis not present

## 2023-02-26 DIAGNOSIS — Z7409 Other reduced mobility: Secondary | ICD-10-CM | POA: Diagnosis not present

## 2023-02-26 DIAGNOSIS — M25662 Stiffness of left knee, not elsewhere classified: Secondary | ICD-10-CM | POA: Diagnosis not present

## 2023-02-26 DIAGNOSIS — R29898 Other symptoms and signs involving the musculoskeletal system: Secondary | ICD-10-CM | POA: Diagnosis not present

## 2023-02-26 DIAGNOSIS — Z96652 Presence of left artificial knee joint: Secondary | ICD-10-CM | POA: Diagnosis not present

## 2023-02-26 DIAGNOSIS — M25562 Pain in left knee: Secondary | ICD-10-CM | POA: Diagnosis not present

## 2023-03-01 DIAGNOSIS — M25662 Stiffness of left knee, not elsewhere classified: Secondary | ICD-10-CM | POA: Diagnosis not present

## 2023-03-01 DIAGNOSIS — M25462 Effusion, left knee: Secondary | ICD-10-CM | POA: Diagnosis not present

## 2023-03-01 DIAGNOSIS — Z7409 Other reduced mobility: Secondary | ICD-10-CM | POA: Diagnosis not present

## 2023-03-01 DIAGNOSIS — R29898 Other symptoms and signs involving the musculoskeletal system: Secondary | ICD-10-CM | POA: Diagnosis not present

## 2023-03-01 DIAGNOSIS — Z96652 Presence of left artificial knee joint: Secondary | ICD-10-CM | POA: Diagnosis not present

## 2023-03-01 DIAGNOSIS — M25562 Pain in left knee: Secondary | ICD-10-CM | POA: Diagnosis not present

## 2023-03-05 DIAGNOSIS — R29898 Other symptoms and signs involving the musculoskeletal system: Secondary | ICD-10-CM | POA: Diagnosis not present

## 2023-03-05 DIAGNOSIS — Z7409 Other reduced mobility: Secondary | ICD-10-CM | POA: Diagnosis not present

## 2023-03-05 DIAGNOSIS — Z96652 Presence of left artificial knee joint: Secondary | ICD-10-CM | POA: Diagnosis not present

## 2023-03-05 DIAGNOSIS — M25562 Pain in left knee: Secondary | ICD-10-CM | POA: Diagnosis not present

## 2023-03-05 DIAGNOSIS — M25662 Stiffness of left knee, not elsewhere classified: Secondary | ICD-10-CM | POA: Diagnosis not present

## 2023-03-05 DIAGNOSIS — M25462 Effusion, left knee: Secondary | ICD-10-CM | POA: Diagnosis not present

## 2023-03-07 DIAGNOSIS — Z96652 Presence of left artificial knee joint: Secondary | ICD-10-CM | POA: Diagnosis not present

## 2023-03-08 DIAGNOSIS — Z96652 Presence of left artificial knee joint: Secondary | ICD-10-CM | POA: Diagnosis not present

## 2023-03-08 DIAGNOSIS — M25462 Effusion, left knee: Secondary | ICD-10-CM | POA: Diagnosis not present

## 2023-03-08 DIAGNOSIS — Z7409 Other reduced mobility: Secondary | ICD-10-CM | POA: Diagnosis not present

## 2023-03-08 DIAGNOSIS — R29898 Other symptoms and signs involving the musculoskeletal system: Secondary | ICD-10-CM | POA: Diagnosis not present

## 2023-03-08 DIAGNOSIS — M25662 Stiffness of left knee, not elsewhere classified: Secondary | ICD-10-CM | POA: Diagnosis not present

## 2023-03-08 DIAGNOSIS — M25562 Pain in left knee: Secondary | ICD-10-CM | POA: Diagnosis not present

## 2023-03-12 DIAGNOSIS — R29898 Other symptoms and signs involving the musculoskeletal system: Secondary | ICD-10-CM | POA: Diagnosis not present

## 2023-03-12 DIAGNOSIS — Z96652 Presence of left artificial knee joint: Secondary | ICD-10-CM | POA: Diagnosis not present

## 2023-03-12 DIAGNOSIS — M25562 Pain in left knee: Secondary | ICD-10-CM | POA: Diagnosis not present

## 2023-03-12 DIAGNOSIS — Z7409 Other reduced mobility: Secondary | ICD-10-CM | POA: Diagnosis not present

## 2023-03-12 DIAGNOSIS — M25662 Stiffness of left knee, not elsewhere classified: Secondary | ICD-10-CM | POA: Diagnosis not present

## 2023-03-12 DIAGNOSIS — G8929 Other chronic pain: Secondary | ICD-10-CM | POA: Diagnosis not present

## 2023-03-12 DIAGNOSIS — M25462 Effusion, left knee: Secondary | ICD-10-CM | POA: Diagnosis not present

## 2023-03-13 DIAGNOSIS — H35371 Puckering of macula, right eye: Secondary | ICD-10-CM | POA: Diagnosis not present

## 2023-03-13 DIAGNOSIS — H26491 Other secondary cataract, right eye: Secondary | ICD-10-CM | POA: Diagnosis not present

## 2023-03-19 DIAGNOSIS — H26491 Other secondary cataract, right eye: Secondary | ICD-10-CM | POA: Diagnosis not present

## 2023-03-21 DIAGNOSIS — M25662 Stiffness of left knee, not elsewhere classified: Secondary | ICD-10-CM | POA: Diagnosis not present

## 2023-03-21 DIAGNOSIS — H35371 Puckering of macula, right eye: Secondary | ICD-10-CM | POA: Diagnosis not present

## 2023-03-21 DIAGNOSIS — Z96652 Presence of left artificial knee joint: Secondary | ICD-10-CM | POA: Diagnosis not present

## 2023-03-21 DIAGNOSIS — H35033 Hypertensive retinopathy, bilateral: Secondary | ICD-10-CM | POA: Diagnosis not present

## 2023-03-21 DIAGNOSIS — H43813 Vitreous degeneration, bilateral: Secondary | ICD-10-CM | POA: Diagnosis not present

## 2023-03-21 DIAGNOSIS — R29898 Other symptoms and signs involving the musculoskeletal system: Secondary | ICD-10-CM | POA: Diagnosis not present

## 2023-03-21 DIAGNOSIS — M25562 Pain in left knee: Secondary | ICD-10-CM | POA: Diagnosis not present

## 2023-03-21 DIAGNOSIS — M25462 Effusion, left knee: Secondary | ICD-10-CM | POA: Diagnosis not present

## 2023-03-21 DIAGNOSIS — H2512 Age-related nuclear cataract, left eye: Secondary | ICD-10-CM | POA: Diagnosis not present

## 2023-03-21 DIAGNOSIS — Z7409 Other reduced mobility: Secondary | ICD-10-CM | POA: Diagnosis not present

## 2023-03-22 DIAGNOSIS — Z125 Encounter for screening for malignant neoplasm of prostate: Secondary | ICD-10-CM | POA: Diagnosis not present

## 2023-03-22 DIAGNOSIS — Z0000 Encounter for general adult medical examination without abnormal findings: Secondary | ICD-10-CM | POA: Diagnosis not present

## 2023-03-22 DIAGNOSIS — K760 Fatty (change of) liver, not elsewhere classified: Secondary | ICD-10-CM | POA: Diagnosis not present

## 2023-03-22 DIAGNOSIS — Z23 Encounter for immunization: Secondary | ICD-10-CM | POA: Diagnosis not present

## 2023-03-22 DIAGNOSIS — K219 Gastro-esophageal reflux disease without esophagitis: Secondary | ICD-10-CM | POA: Diagnosis not present

## 2023-03-22 DIAGNOSIS — I10 Essential (primary) hypertension: Secondary | ICD-10-CM | POA: Diagnosis not present

## 2023-03-22 DIAGNOSIS — R7303 Prediabetes: Secondary | ICD-10-CM | POA: Diagnosis not present

## 2023-03-22 DIAGNOSIS — E7800 Pure hypercholesterolemia, unspecified: Secondary | ICD-10-CM | POA: Diagnosis not present

## 2023-03-25 DIAGNOSIS — R29898 Other symptoms and signs involving the musculoskeletal system: Secondary | ICD-10-CM | POA: Diagnosis not present

## 2023-03-25 DIAGNOSIS — Z96652 Presence of left artificial knee joint: Secondary | ICD-10-CM | POA: Diagnosis not present

## 2023-03-25 DIAGNOSIS — M25562 Pain in left knee: Secondary | ICD-10-CM | POA: Diagnosis not present

## 2023-03-25 DIAGNOSIS — M25462 Effusion, left knee: Secondary | ICD-10-CM | POA: Diagnosis not present

## 2023-03-25 DIAGNOSIS — Z7409 Other reduced mobility: Secondary | ICD-10-CM | POA: Diagnosis not present

## 2023-03-25 DIAGNOSIS — M25662 Stiffness of left knee, not elsewhere classified: Secondary | ICD-10-CM | POA: Diagnosis not present

## 2023-04-01 DIAGNOSIS — R29898 Other symptoms and signs involving the musculoskeletal system: Secondary | ICD-10-CM | POA: Diagnosis not present

## 2023-04-01 DIAGNOSIS — M25462 Effusion, left knee: Secondary | ICD-10-CM | POA: Diagnosis not present

## 2023-04-01 DIAGNOSIS — Z7409 Other reduced mobility: Secondary | ICD-10-CM | POA: Diagnosis not present

## 2023-04-01 DIAGNOSIS — Z96652 Presence of left artificial knee joint: Secondary | ICD-10-CM | POA: Diagnosis not present

## 2023-04-01 DIAGNOSIS — M25662 Stiffness of left knee, not elsewhere classified: Secondary | ICD-10-CM | POA: Diagnosis not present

## 2023-04-01 DIAGNOSIS — M25562 Pain in left knee: Secondary | ICD-10-CM | POA: Diagnosis not present

## 2023-04-08 DIAGNOSIS — Z96652 Presence of left artificial knee joint: Secondary | ICD-10-CM | POA: Diagnosis not present

## 2023-09-13 DIAGNOSIS — H26491 Other secondary cataract, right eye: Secondary | ICD-10-CM | POA: Diagnosis not present

## 2023-09-13 DIAGNOSIS — H43391 Other vitreous opacities, right eye: Secondary | ICD-10-CM | POA: Diagnosis not present

## 2023-09-13 DIAGNOSIS — H2512 Age-related nuclear cataract, left eye: Secondary | ICD-10-CM | POA: Diagnosis not present

## 2023-09-13 DIAGNOSIS — H35371 Puckering of macula, right eye: Secondary | ICD-10-CM | POA: Diagnosis not present

## 2023-09-13 DIAGNOSIS — H43813 Vitreous degeneration, bilateral: Secondary | ICD-10-CM | POA: Diagnosis not present

## 2023-09-13 DIAGNOSIS — H35033 Hypertensive retinopathy, bilateral: Secondary | ICD-10-CM | POA: Diagnosis not present

## 2023-10-18 DIAGNOSIS — H33311 Horseshoe tear of retina without detachment, right eye: Secondary | ICD-10-CM | POA: Diagnosis not present

## 2023-10-18 DIAGNOSIS — H35371 Puckering of macula, right eye: Secondary | ICD-10-CM | POA: Diagnosis not present

## 2023-10-18 DIAGNOSIS — H26491 Other secondary cataract, right eye: Secondary | ICD-10-CM | POA: Diagnosis not present

## 2023-10-30 DIAGNOSIS — Z9889 Other specified postprocedural states: Secondary | ICD-10-CM | POA: Diagnosis not present

## 2023-10-30 DIAGNOSIS — H33311 Horseshoe tear of retina without detachment, right eye: Secondary | ICD-10-CM | POA: Diagnosis not present

## 2023-10-31 DIAGNOSIS — H33021 Retinal detachment with multiple breaks, right eye: Secondary | ICD-10-CM | POA: Diagnosis not present

## 2023-10-31 DIAGNOSIS — H33011 Retinal detachment with single break, right eye: Secondary | ICD-10-CM | POA: Diagnosis not present

## 2023-10-31 DIAGNOSIS — H3581 Retinal edema: Secondary | ICD-10-CM | POA: Diagnosis not present

## 2023-11-21 DIAGNOSIS — I10 Essential (primary) hypertension: Secondary | ICD-10-CM | POA: Diagnosis not present

## 2023-11-21 DIAGNOSIS — K219 Gastro-esophageal reflux disease without esophagitis: Secondary | ICD-10-CM | POA: Diagnosis not present

## 2023-11-21 DIAGNOSIS — E7800 Pure hypercholesterolemia, unspecified: Secondary | ICD-10-CM | POA: Diagnosis not present

## 2023-11-21 DIAGNOSIS — K760 Fatty (change of) liver, not elsewhere classified: Secondary | ICD-10-CM | POA: Diagnosis not present

## 2023-11-21 DIAGNOSIS — R7303 Prediabetes: Secondary | ICD-10-CM | POA: Diagnosis not present

## 2024-01-31 DIAGNOSIS — Z96652 Presence of left artificial knee joint: Secondary | ICD-10-CM | POA: Diagnosis not present

## 2024-02-21 DIAGNOSIS — H43812 Vitreous degeneration, left eye: Secondary | ICD-10-CM | POA: Diagnosis not present

## 2024-03-31 DIAGNOSIS — K219 Gastro-esophageal reflux disease without esophagitis: Secondary | ICD-10-CM | POA: Diagnosis not present

## 2024-03-31 DIAGNOSIS — K760 Fatty (change of) liver, not elsewhere classified: Secondary | ICD-10-CM | POA: Diagnosis not present

## 2024-03-31 DIAGNOSIS — Z0000 Encounter for general adult medical examination without abnormal findings: Secondary | ICD-10-CM | POA: Diagnosis not present

## 2024-03-31 DIAGNOSIS — R7303 Prediabetes: Secondary | ICD-10-CM | POA: Diagnosis not present

## 2024-03-31 DIAGNOSIS — E7800 Pure hypercholesterolemia, unspecified: Secondary | ICD-10-CM | POA: Diagnosis not present

## 2024-03-31 DIAGNOSIS — I10 Essential (primary) hypertension: Secondary | ICD-10-CM | POA: Diagnosis not present

## 2024-03-31 DIAGNOSIS — E039 Hypothyroidism, unspecified: Secondary | ICD-10-CM | POA: Diagnosis not present

## 2024-04-23 DIAGNOSIS — H35351 Cystoid macular degeneration, right eye: Secondary | ICD-10-CM | POA: Diagnosis not present

## 2024-04-23 DIAGNOSIS — H43812 Vitreous degeneration, left eye: Secondary | ICD-10-CM | POA: Diagnosis not present

## 2024-04-23 DIAGNOSIS — H2512 Age-related nuclear cataract, left eye: Secondary | ICD-10-CM | POA: Diagnosis not present

## 2024-06-10 DIAGNOSIS — H43812 Vitreous degeneration, left eye: Secondary | ICD-10-CM | POA: Diagnosis not present

## 2024-06-10 DIAGNOSIS — H2512 Age-related nuclear cataract, left eye: Secondary | ICD-10-CM | POA: Diagnosis not present

## 2024-06-10 DIAGNOSIS — H52202 Unspecified astigmatism, left eye: Secondary | ICD-10-CM | POA: Diagnosis not present

## 2024-06-10 DIAGNOSIS — H524 Presbyopia: Secondary | ICD-10-CM | POA: Diagnosis not present

## 2024-06-10 DIAGNOSIS — H25012 Cortical age-related cataract, left eye: Secondary | ICD-10-CM | POA: Diagnosis not present

## 2024-07-03 DIAGNOSIS — M5127 Other intervertebral disc displacement, lumbosacral region: Secondary | ICD-10-CM | POA: Diagnosis not present

## 2024-07-03 DIAGNOSIS — Z6841 Body Mass Index (BMI) 40.0 and over, adult: Secondary | ICD-10-CM | POA: Diagnosis not present

## 2024-07-03 DIAGNOSIS — M4807 Spinal stenosis, lumbosacral region: Secondary | ICD-10-CM | POA: Diagnosis not present

## 2024-07-28 DIAGNOSIS — M5116 Intervertebral disc disorders with radiculopathy, lumbar region: Secondary | ICD-10-CM | POA: Diagnosis not present

## 2024-07-28 DIAGNOSIS — M5416 Radiculopathy, lumbar region: Secondary | ICD-10-CM | POA: Diagnosis not present

## 2024-09-24 DIAGNOSIS — H43812 Vitreous degeneration, left eye: Secondary | ICD-10-CM | POA: Diagnosis not present

## 2024-09-24 DIAGNOSIS — H2512 Age-related nuclear cataract, left eye: Secondary | ICD-10-CM | POA: Diagnosis not present

## 2024-09-24 DIAGNOSIS — H35351 Cystoid macular degeneration, right eye: Secondary | ICD-10-CM | POA: Diagnosis not present

## 2024-09-25 DIAGNOSIS — R600 Localized edema: Secondary | ICD-10-CM | POA: Diagnosis not present

## 2024-09-25 DIAGNOSIS — M170 Bilateral primary osteoarthritis of knee: Secondary | ICD-10-CM | POA: Diagnosis not present

## 2024-09-25 DIAGNOSIS — K219 Gastro-esophageal reflux disease without esophagitis: Secondary | ICD-10-CM | POA: Diagnosis not present

## 2024-09-25 DIAGNOSIS — Z125 Encounter for screening for malignant neoplasm of prostate: Secondary | ICD-10-CM | POA: Diagnosis not present

## 2024-09-25 DIAGNOSIS — K760 Fatty (change of) liver, not elsewhere classified: Secondary | ICD-10-CM | POA: Diagnosis not present

## 2024-09-25 DIAGNOSIS — E7800 Pure hypercholesterolemia, unspecified: Secondary | ICD-10-CM | POA: Diagnosis not present

## 2024-09-25 DIAGNOSIS — I10 Essential (primary) hypertension: Secondary | ICD-10-CM | POA: Diagnosis not present

## 2024-09-25 DIAGNOSIS — Z23 Encounter for immunization: Secondary | ICD-10-CM | POA: Diagnosis not present

## 2024-09-25 DIAGNOSIS — E039 Hypothyroidism, unspecified: Secondary | ICD-10-CM | POA: Diagnosis not present

## 2024-09-25 DIAGNOSIS — R7303 Prediabetes: Secondary | ICD-10-CM | POA: Diagnosis not present
